# Patient Record
Sex: Male | Born: 1940 | Race: White | Hispanic: No | Marital: Married | Smoking: Former smoker
Health system: Southern US, Community
[De-identification: ages and names within clinical notes are randomized; demographics above are authoritative.]

## PROBLEM LIST (undated history)

## (undated) DIAGNOSIS — I2510 Atherosclerotic heart disease of native coronary artery without angina pectoris: Secondary | ICD-10-CM

## (undated) DIAGNOSIS — I219 Acute myocardial infarction, unspecified: Secondary | ICD-10-CM

## (undated) DIAGNOSIS — K648 Other hemorrhoids: Secondary | ICD-10-CM

## (undated) DIAGNOSIS — K222 Esophageal obstruction: Secondary | ICD-10-CM

## (undated) DIAGNOSIS — K219 Gastro-esophageal reflux disease without esophagitis: Secondary | ICD-10-CM

## (undated) DIAGNOSIS — G2581 Restless legs syndrome: Secondary | ICD-10-CM

## (undated) DIAGNOSIS — E669 Obesity, unspecified: Secondary | ICD-10-CM

## (undated) DIAGNOSIS — K224 Dyskinesia of esophagus: Secondary | ICD-10-CM

## (undated) DIAGNOSIS — D649 Anemia, unspecified: Secondary | ICD-10-CM

## (undated) DIAGNOSIS — Z86010 Personal history of colonic polyps: Secondary | ICD-10-CM

## (undated) DIAGNOSIS — J986 Disorders of diaphragm: Secondary | ICD-10-CM

## (undated) DIAGNOSIS — E785 Hyperlipidemia, unspecified: Secondary | ICD-10-CM

## (undated) DIAGNOSIS — I10 Essential (primary) hypertension: Secondary | ICD-10-CM

## (undated) DIAGNOSIS — J189 Pneumonia, unspecified organism: Secondary | ICD-10-CM

## (undated) DIAGNOSIS — Z8719 Personal history of other diseases of the digestive system: Secondary | ICD-10-CM

## (undated) DIAGNOSIS — M5090 Cervical disc disorder, unspecified, unspecified cervical region: Secondary | ICD-10-CM

## (undated) DIAGNOSIS — Z860101 Personal history of adenomatous and serrated colon polyps: Secondary | ICD-10-CM

## (undated) DIAGNOSIS — C4490 Unspecified malignant neoplasm of skin, unspecified: Secondary | ICD-10-CM

## (undated) DIAGNOSIS — Z9889 Other specified postprocedural states: Secondary | ICD-10-CM

## (undated) DIAGNOSIS — I358 Other nonrheumatic aortic valve disorders: Secondary | ICD-10-CM

## (undated) DIAGNOSIS — Z9981 Dependence on supplemental oxygen: Secondary | ICD-10-CM

## (undated) DIAGNOSIS — K5790 Diverticulosis of intestine, part unspecified, without perforation or abscess without bleeding: Secondary | ICD-10-CM

## (undated) DIAGNOSIS — IMO0001 Reserved for inherently not codable concepts without codable children: Secondary | ICD-10-CM

## (undated) HISTORY — DX: Other nonrheumatic aortic valve disorders: I35.8

## (undated) HISTORY — DX: Diverticulosis of intestine, part unspecified, without perforation or abscess without bleeding: K57.90

## (undated) HISTORY — DX: Dyskinesia of esophagus: K22.4

## (undated) HISTORY — DX: Unspecified malignant neoplasm of skin, unspecified: C44.90

## (undated) HISTORY — DX: Obesity, unspecified: E66.9

## (undated) HISTORY — PX: TONSILLECTOMY: SUR1361

## (undated) HISTORY — PX: SKIN CANCER EXCISION: SHX779

## (undated) HISTORY — DX: Acute myocardial infarction, unspecified: I21.9

## (undated) HISTORY — PX: INGUINAL HERNIA REPAIR: SUR1180

## (undated) HISTORY — DX: Hyperlipidemia, unspecified: E78.5

## (undated) HISTORY — DX: Gastro-esophageal reflux disease without esophagitis: K21.9

## (undated) HISTORY — DX: Personal history of other diseases of the digestive system: Z87.19

## (undated) HISTORY — DX: Essential (primary) hypertension: I10

## (undated) HISTORY — DX: Atherosclerotic heart disease of native coronary artery without angina pectoris: I25.10

## (undated) HISTORY — DX: Esophageal obstruction: K22.2

## (undated) HISTORY — DX: Pneumonia, unspecified organism: J18.9

## (undated) HISTORY — DX: Other hemorrhoids: K64.8

## (undated) HISTORY — DX: Cervical disc disorder, unspecified, unspecified cervical region: M50.90

## (undated) HISTORY — PX: CARDIAC CATHETERIZATION: SHX172

## (undated) HISTORY — DX: Disorders of diaphragm: J98.6

## (undated) HISTORY — DX: Restless legs syndrome: G25.81

## (undated) HISTORY — DX: Other specified postprocedural states: Z98.890

---

## 1998-05-02 HISTORY — PX: CORONARY ANGIOPLASTY WITH STENT PLACEMENT: SHX49

## 1999-04-02 DIAGNOSIS — I219 Acute myocardial infarction, unspecified: Secondary | ICD-10-CM

## 1999-04-02 HISTORY — DX: Acute myocardial infarction, unspecified: I21.9

## 1999-04-14 ENCOUNTER — Inpatient Hospital Stay (HOSPITAL_COMMUNITY): Admission: EM | Admit: 1999-04-14 | Discharge: 1999-04-17 | Payer: Self-pay | Admitting: Cardiology

## 2001-07-02 ENCOUNTER — Encounter: Payer: Self-pay | Admitting: Emergency Medicine

## 2001-07-02 ENCOUNTER — Encounter: Admission: RE | Admit: 2001-07-02 | Discharge: 2001-07-02 | Payer: Self-pay | Admitting: Emergency Medicine

## 2001-08-30 HISTORY — PX: COLONOSCOPY: SHX174

## 2001-09-05 ENCOUNTER — Ambulatory Visit (HOSPITAL_COMMUNITY): Admission: RE | Admit: 2001-09-05 | Discharge: 2001-09-05 | Payer: Self-pay | Admitting: Gastroenterology

## 2001-09-05 ENCOUNTER — Encounter: Payer: Self-pay | Admitting: Internal Medicine

## 2003-01-24 ENCOUNTER — Encounter: Payer: Self-pay | Admitting: Internal Medicine

## 2003-01-29 ENCOUNTER — Encounter: Payer: Self-pay | Admitting: Internal Medicine

## 2003-01-29 ENCOUNTER — Encounter: Payer: Self-pay | Admitting: Emergency Medicine

## 2003-01-29 ENCOUNTER — Encounter: Admission: RE | Admit: 2003-01-29 | Discharge: 2003-01-29 | Payer: Self-pay | Admitting: Emergency Medicine

## 2004-01-15 ENCOUNTER — Encounter: Admission: RE | Admit: 2004-01-15 | Discharge: 2004-01-15 | Payer: Self-pay | Admitting: Emergency Medicine

## 2004-05-28 ENCOUNTER — Encounter: Admission: RE | Admit: 2004-05-28 | Discharge: 2004-05-28 | Payer: Self-pay | Admitting: Emergency Medicine

## 2004-08-05 ENCOUNTER — Ambulatory Visit: Payer: Self-pay | Admitting: Cardiology

## 2004-11-10 ENCOUNTER — Ambulatory Visit: Payer: Self-pay | Admitting: Cardiology

## 2005-01-18 ENCOUNTER — Ambulatory Visit: Payer: Self-pay | Admitting: Cardiology

## 2005-08-25 ENCOUNTER — Ambulatory Visit: Payer: Self-pay | Admitting: *Deleted

## 2005-10-10 ENCOUNTER — Ambulatory Visit: Payer: Self-pay

## 2008-01-28 ENCOUNTER — Ambulatory Visit: Payer: Self-pay | Admitting: Internal Medicine

## 2008-01-28 DIAGNOSIS — K224 Dyskinesia of esophagus: Secondary | ICD-10-CM

## 2008-01-28 DIAGNOSIS — K219 Gastro-esophageal reflux disease without esophagitis: Secondary | ICD-10-CM

## 2008-01-31 HISTORY — PX: ESOPHAGOGASTRODUODENOSCOPY: SHX1529

## 2008-02-05 ENCOUNTER — Encounter: Payer: Self-pay | Admitting: Internal Medicine

## 2008-02-05 ENCOUNTER — Ambulatory Visit: Payer: Self-pay | Admitting: Internal Medicine

## 2008-02-13 ENCOUNTER — Encounter: Payer: Self-pay | Admitting: Internal Medicine

## 2008-03-05 ENCOUNTER — Ambulatory Visit: Payer: Self-pay | Admitting: Internal Medicine

## 2010-01-30 DIAGNOSIS — J189 Pneumonia, unspecified organism: Secondary | ICD-10-CM

## 2010-01-30 HISTORY — DX: Pneumonia, unspecified organism: J18.9

## 2010-02-09 ENCOUNTER — Emergency Department (HOSPITAL_COMMUNITY): Admission: EM | Admit: 2010-02-09 | Discharge: 2010-02-09 | Payer: Self-pay | Admitting: Emergency Medicine

## 2010-02-10 ENCOUNTER — Inpatient Hospital Stay (HOSPITAL_COMMUNITY): Admission: AD | Admit: 2010-02-10 | Discharge: 2010-02-12 | Payer: Self-pay | Admitting: Cardiology

## 2010-02-10 ENCOUNTER — Ambulatory Visit: Payer: Self-pay | Admitting: Cardiology

## 2010-02-10 ENCOUNTER — Encounter: Payer: Self-pay | Admitting: Cardiology

## 2010-02-18 ENCOUNTER — Telehealth (INDEPENDENT_AMBULATORY_CARE_PROVIDER_SITE_OTHER): Payer: Self-pay | Admitting: *Deleted

## 2010-02-19 DIAGNOSIS — I10 Essential (primary) hypertension: Secondary | ICD-10-CM | POA: Insufficient documentation

## 2010-02-19 DIAGNOSIS — R079 Chest pain, unspecified: Secondary | ICD-10-CM

## 2010-02-19 DIAGNOSIS — E785 Hyperlipidemia, unspecified: Secondary | ICD-10-CM

## 2010-02-20 ENCOUNTER — Encounter: Payer: Self-pay | Admitting: Cardiology

## 2010-02-20 DIAGNOSIS — I2510 Atherosclerotic heart disease of native coronary artery without angina pectoris: Secondary | ICD-10-CM | POA: Insufficient documentation

## 2010-02-22 ENCOUNTER — Ambulatory Visit: Payer: Self-pay

## 2010-02-22 ENCOUNTER — Encounter (HOSPITAL_COMMUNITY)
Admission: RE | Admit: 2010-02-22 | Discharge: 2010-05-01 | Payer: Self-pay | Source: Home / Self Care | Attending: Cardiology | Admitting: Cardiology

## 2010-02-22 ENCOUNTER — Encounter: Payer: Self-pay | Admitting: Internal Medicine

## 2010-02-22 ENCOUNTER — Ambulatory Visit: Payer: Self-pay | Admitting: Internal Medicine

## 2010-02-25 ENCOUNTER — Ambulatory Visit: Payer: Self-pay | Admitting: Cardiology

## 2010-02-25 DIAGNOSIS — I359 Nonrheumatic aortic valve disorder, unspecified: Secondary | ICD-10-CM | POA: Insufficient documentation

## 2010-03-15 ENCOUNTER — Ambulatory Visit: Payer: Self-pay | Admitting: Internal Medicine

## 2010-03-15 DIAGNOSIS — R140 Abdominal distension (gaseous): Secondary | ICD-10-CM

## 2010-03-29 ENCOUNTER — Telehealth: Payer: Self-pay | Admitting: Internal Medicine

## 2010-05-07 ENCOUNTER — Ambulatory Visit
Admission: RE | Admit: 2010-05-07 | Discharge: 2010-05-07 | Payer: Self-pay | Source: Home / Self Care | Attending: Internal Medicine | Admitting: Internal Medicine

## 2010-06-01 NOTE — Miscellaneous (Signed)
  Clinical Lists Changes  Problems: Added new problem of CAD (ICD-414.00) Observations: Added new observation of PAST MED HX: CAD   MI...04/1999...subtotal LAD.Marland Kitchenstented.......residual  50% distal LAD, Cx 50-70%, 40% RCA  /  nuclear...2005...no ischemia GERD Esophageal Dysmotility Hypertension Dyslipidemia Myalgias Diverticulosis EF  64%...NUCLEAR...2005  /  60-65%....ECHO...02/10/2010 Hospitalization 02/10/2010.Marland KitchenMarland Kitchen?pneumonia ?other with orthostasis,SOB, Chest pain...  (02/20/2010 21:36) Added new observation of PRIMARY MD: Melissa Noon (02/20/2010 21:36)       Past History:  Past Medical History: CAD   MI...04/1999...subtotal LAD.Marland Kitchenstented.......residual  50% distal LAD, Cx 50-70%, 40% RCA  /  nuclear...2005...no ischemia GERD Esophageal Dysmotility Hypertension Dyslipidemia Myalgias Diverticulosis EF  64%...NUCLEAR...2005  /  60-65%....ECHO...02/10/2010 Hospitalization 02/10/2010.Marland KitchenMarland Kitchen?pneumonia ?other with orthostasis,SOB, Chest pain.Marland KitchenMarland Kitchen

## 2010-06-01 NOTE — Progress Notes (Signed)
Summary: Condition update  Phone Note Call from Patient Call back at Home Phone [phone] Call back at [phone] cell   Caller: Patient Call For: Dr. Leone Payor Reason for Call: Talk to Nurse Summary of Call: Condition update: Has not taken the antibotic that was prescribed b/c he has some dental work done, no improvement Initial call taken by: Karna Christmas,  March 29, 2010 12:00 PM  Follow-up for Phone Call        Left message for patient to call back Darcey Nora RN, [facility]  March 29, 2010 1:40 PM  Patient was started on an antibiotic he can't remember the name.  He has had a tooth pulled. He was advised by his dentist to hold metronidazole until he completed the antibiotics.  He tried metronidazole after his course of antibiotics. 1 pill "made me very sick".  I reviewed alcohol use with him and he denies.  He has had some very slight improvement with probiotics.  Please advise on metronidazole. Follow-up by: Darcey Nora RN, CGRN,  March 29, 2010 2:12 PM  Additional Follow-up for Phone Call Additional follow up Details #1::        He can try Align daily for 1 month if he does not want to take metronidazole then REV me in 4-6 weks from now Additional Follow-up by: Iva Boop MD, Clementeen Graham,  March 29, 2010 4:26 PM    Additional Follow-up for Phone Call Additional follow up Details #2::    Patient  advised of Dr Marvell Fuller recommendations.  REV scheduled for 05/07/10 1:30 Follow-up by: Darcey Nora RN, CGRN,  March 29, 2010 4:58 PM

## 2010-06-01 NOTE — Assessment & Plan Note (Signed)
Summary: GERD--ch.   History of Present Illness Visit Type: Follow-up Visit Primary GI MD: Stan Head MD [facility] Primary Provider: [name], MD Requesting Provider: Effie Shy Chief Complaint: Severe indigestion after taking a large amount of antibiotics from when he was at [facility] for pneomonia and breathing issues History of Present Illness:   70 yo wm with significant gas and bloating as well as heartburn and acid reflux issues. These problems began after he took antibiotics for pneumonia as an inpatient and outpatient last month (Avelox). He has experienced dysphagia, with water or solids, unabl eto swallow. Similar to prior problems. Perhaps more severe.  He had been on pantoprazole and then Zegerid with success. He thinks drinking Telford Nab tea allowed hm to have complete relief of heartburn (with PPI)  he has tried Librarian, academic without benefit (x 5 days)  +++gas and borborygmi no diarrhea was on Avelox for pneumonia - firts oral dose was vomited   GI Review of Systems    Reports acid reflux, belching, heartburn, and  nausea.      Denies abdominal pain, bloating, chest pain, dysphagia with liquids, dysphagia with solids, loss of appetite, vomiting, vomiting blood, weight loss, and  weight gain.        Denies anal fissure, black tarry stools, change in bowel habit, constipation, diarrhea, diverticulosis, fecal incontinence, heme positive stool, hemorrhoids, irritable bowel syndrome, jaundice, light color stool, liver problems, rectal bleeding, and  rectal pain.    Current Medications (verified): 1)  Metoprolol Tartrate 50 Mg Tabs (Metoprolol Tartrate) .Marland Kitchen.. 1 Tab By Mouth Once Daily 2)  Crestor 10 Mg Tabs (Rosuvastatin Calcium) .Marland Kitchen.. 1 By Mouth Once Daily 3)  Aspirin 81 Mg Tabs (Aspirin) .... 2 By Mouth Once Daily 4)  Zegerid 40-1100 Mg Caps (Omeprazole-Sodium Bicarbonate) .Marland Kitchen.. 1 By Mouth Once Daily 5)  Nitrostat 0.4 Mg Subl (Nitroglycerin) .Marland Kitchen.. 1 Tablet Under Tongue At  Onset of Chest Pain; You May Repeat Every 5 Minutes For Up To 3 Doses. 6)  Hydrocodone-Acetaminophen 5-500 Mg Tabs (Hydrocodone-Acetaminophen) .... As Needed 7)  Quinapril-Hydrochlorothiazide 20-12.5 Mg Tabs (Quinapril-Hydrochlorothiazide) .Marland Kitchen.. 1 By Mouth Once Daily  Allergies (verified): No Known Drug Allergies  Past History:  Past Surgical History: Last updated: 01/28/2008 PTCA-Stent Hernia repair  Family History: Last updated: 01/28/2008 No FH of Colon Cancer:  Social History: Last updated: 03/15/2010 Patient is a former smoker.  Alcohol Use - no Daily Caffeine Use: rare married, lives with wife and daughter (adopted) Illicit Drug Use - no Patient does not get regular exercise.   Past Medical History: CAD   MI...04/1999...subtotal LAD.Marland Kitchenstented.......residual  50% distal LAD, Cx 50-70%, 40% RCA  /  nuclear...2005...no ischemia GERD Esophageal Dysmotility Hypertension Dyslipidemia Myalgias Diverticulosis EF  64%...NUCLEAR...2005  /  60-65%....ECHO...02/10/2010 Aortic valve sclerosis    echo... October, 2011 Hospitalization 02/10/2010.Marland KitchenMarland Kitchen?pneumonia ?other with orthostasis,SOB, Chest pain... Elevated right hemidiaphragm Pneumonia  Family History: Reviewed history from 01/28/2008 and no changes required. No FH of Colon Cancer:  Social History: Patient is a former smoker.  Alcohol Use - no Daily Caffeine Use: rare married, lives with wife and daughter (adopted) Illicit Drug Use - no Patient does not get regular exercise.   Review of Systems       The patient complains of shortness of breath.         indigestion neck pain and radiation of pain into left arm - just had epidural All other ROS negative except as per HPI.   Vital Signs:  Patient profile:   69 year  old male Height:      69 inches Weight:      198 pounds BMI:     29.35 BSA:     2.06 Pulse rate:   80 / minute Pulse rhythm:   regular BP sitting:   110 / 70  (left arm)  Vitals Entered By:  Merri Ray CMA Duncan Dull) (March 15, 2010 3:49 PM)  Physical Exam  Eyes:  anicteric Lungs:  Clear throughout to auscultation. Heart:  Regular rate and rhythm; no murmurs, rubs,  or bruits. Abdomen:  mildly distended BS+ borborygmi nontender soft Extremities:  no edema Psych:  Alert and cooperative. Normal mood and affect.   Impression & Recommendations:  Problem # 1:  FLATULENCE ERUCTATION AND GAS PAIN (ICD-787.3) Assessment New This developed after antibiotic therapy for pneumonia which suggests disruption of bacterial flora as a cause. Will treat with metronidazole to see if that resolves.  Problem # 2:  GERD (ICD-530.81) Assessment: Deteriorated started after the antibiotics. ? related to this and suspected change in bacterial flora of intestine that is suspected. Se what happens after metronidazole. he understands the theory being applied.  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  Patient Instructions: 1)  Please pick up your medications at your pharmacy. METRONIDAZOLE 2)  Please call our office in 2-3 weeks with an update.  Call sooner if needed. 3)  Copy sent to : Dossie Arbour, MD; Jerral Bonito, MD 4)  The medication list was reviewed and reconciled.  All changed / newly prescribed medications were explained.  A complete medication list was provided to the patient / caregiver. Prescriptions: METRONIDAZOLE 500 MG TABS (METRONIDAZOLE) Take 1 tablet by mouth three times a day for 10 days  #30 x 0   Entered by:   Francee Piccolo CMA (AAMA)   Authorized by:   Iva Boop MD, [facility]   Signed by:   Francee Piccolo CMA (AAMA) on 03/15/2010   Method used:   Electronically to        [facility]. [phone]* (retail)       [phone]       [place], [place]  [address]       Ph: [phone]       Fax: [phone]   RxID:   [id number]

## 2010-06-01 NOTE — Progress Notes (Signed)
Summary: Nuclear pre procedure  Phone Note Outgoing Call Call back at [facility] Phone [phone]   Call placed by: Rea College, CMA,  February 18, 2010 4:38 PM Call placed to: Patient Summary of Call: Reviewed information on Myoview Information Sheet (see scanned document for further details).  Spoke with patient's daughter.      Nuclear Med Background Indications for Stress Test: Evaluation for Ischemia, Post Hospital  Indications Comments: 02/12/10 chest pain, pneumonia, negative enzymes  History: Echo, Heart Catheterization, Myocardial Infarction, Myocardial Perfusion Study, Stents  History Comments: '05 [id number]  Symptoms: Chest Pain, Chest Pressure, Chest Tightness, Nausea, SOB, Vomiting    Nuclear Pre-Procedure Cardiac Risk Factors: Family History - CAD, History of Smoking, Hypertension, Lipids Height (in): 69

## 2010-06-01 NOTE — Assessment & Plan Note (Signed)
Summary: Cardiology Nuclear Testing  Nuclear Med Background Indications for Stress Test: Evaluation for Ischemia, Stent Patency, Post Hospital  Indications Comments: 02/12/10 chest pain, pneumonia, negative enzymes  History: Echo, Heart Catheterization, Myocardial Infarction, Myocardial Perfusion Study, Stents  History Comments: '05 [id number]  Symptoms: Chest Pain, Chest Pressure, Chest Tightness, DOE, Fatigue with Exertion, Nausea, SOB, Syncope, Vomiting  Symptoms Comments: Syncope after NTG given in hospital recently per patient.   Nuclear Pre-Procedure Cardiac Risk Factors: Family History - CAD, History of Smoking, Hypertension, Lipids Caffeine/Decaff Intake: None NPO After: 6:30 PM Lungs: Clear IV 0.9% NS with Angio Cath: 22g     IV Site: R Wrist IV Started by: Bonnita Levan, RN Chest Size (in) 44     Height (in): 69 Weight (lb): 207 BMI: 30.68  Nuclear Med Study 1 or 2 day study:  1 day     Stress Test Type:  Lexiscan Reading MD:  Dietrich Pates, MD     Referring MD:  Lovena Neighbours Resting Radionuclide:  Technetium 95m Tetrofosmin     Resting Radionuclide Dose:  11 mCi  Stress Radionuclide:  Technetium 74m Tetrofosmin     Stress Radionuclide Dose:  33 mCi   Stress Protocol      Max HR:  101 bpm     Predicted Max HR:  151 bpm  Max Systolic BP: 158 mm Hg     Percent Max HR:  66.89 %Rate Pressure Product:  91478  Lexiscan: 0.4 mg   Stress Test Technologist:  Irean Hong,  RN     Nuclear Technologist:  Domenic Polite, CNMT  Rest Procedure  Myocardial perfusion imaging was performed at rest 45 minutes following the intravenous administration of Technetium 77m Tetrofosmin.  Stress Procedure  The patient received IV Lexiscan 0.4 mg over 15-seconds.  Technetium 36m Tetrofosmin injected at 30-seconds.  There were no significant changes with Lexiscan, occ. PVC's.  Quantitative spect images were obtained after a 45 minute delay.  QPS Raw Data Images:  Soft tissue (diaphragm, bowel  activity, subcutaneous fat) surround heart Stress Images:  Defect in the distal inferoseptal, distal inferior and apical walls.  .  Basal inferolateral, basal inferior thinning.  Otherwise normal perfusion. Rest Images:  No significant change from the stress images. Subtraction (SDS):  No significant ischemia. Transient Ischemic Dilatation:  1.06  (Normal <1.22)  Lung/Heart Ratio:  .33  (Normal <0.45)  Quantitative Gated Spect Images QGS EDV:  104 ml QGS ESV:  41 ml QGS EF:  61 % QGS cine images:  Apical hypokinesis.   Overall Impression  Exercise Capacity: Lexiscan with no exercise. BP Response: Normal blood pressure response. Clinical Symptoms: No chest pain ECG Impression: No significant ST segment change suggestive of ischemia. Overall Impression Comments:  Small region of Scar and possible soft tissue attenuation  in the distal inferior, inferoseptal and apical walls.  No ischemia..  Overall low risk scan.  Appended Document: Cardiology Nuclear Testing Good  Appended Document: Cardiology Nuclear Testing pt aware at appt 10/27

## 2010-06-01 NOTE — Assessment & Plan Note (Signed)
Summary: Todd Stevens call/lg   Visit Type:  post hospitalization Primary Todd Stevens:  Todd Matin, MD  CC:  CAD.  History of Present Illness: The patient is seen post hospitalization.  He is doing well at this time.  I saw him last in our office in 2006.  He was hospitalized on February 10, 2010.  His presentation is unusual.  He had some shortness of breath with orthostasis and some chest pain.  The chest x-ray was mildly abnormal.  There was question of pneumonia.  He did not have an elevated white blood count.  His enzymes were negative.  Echo revealed normal LV function with an ejection fraction of 60-65%.  He does have some aortic valve sclerosis.  Since home he has been stable.  He has been having some problems with his GERD.  Patient had an outpatient nuclear scan February 22, 2010.  This is a low-risk scan.  Current Medications (verified): 1)  Metoprolol Tartrate 50 Mg Tabs (Metoprolol Tartrate) .Marland Kitchen.. 1 Tab By Mouth Once Daily 2)  Lisinopril 20 Mg Tabs (Lisinopril) .... Take One Tablet By Mouth Daily 3)  Crestor 10 Mg Tabs (Rosuvastatin Calcium) .Marland Kitchen.. 1 By Mouth Once Daily 4)  Aspirin 81 Mg Tabs (Aspirin) .... 2 By Mouth Once Daily 5)  Zegerid 40-1100 Mg Caps (Omeprazole-Sodium Bicarbonate) .... Take 1 Tablet By Mouth Two Times A Day 6)  Nitrostat 0.4 Mg Subl (Nitroglycerin) .Marland Kitchen.. 1 Tablet Under Tongue At Onset of Chest Pain; You May Repeat Every 5 Minutes For Up To 3 Doses. 7)  Hydrocodone-Acetaminophen 5-500 Mg Tabs (Hydrocodone-Acetaminophen) .... As Needed 8)  Align  Caps (Probiotic Product) .... Once Daily  Allergies (verified): No Known Drug Allergies  Past History:  Past Medical History: CAD   MI...04/1999...subtotal LAD.Marland Kitchenstented.......residual  50% distal LAD, Cx 50-70%, 40% RCA  /  nuclear...2005...no ischemia GERD Esophageal Dysmotility Hypertension Dyslipidemia Myalgias Diverticulosis EF  64%...NUCLEAR...2005  /  60-65%....ECHO...02/10/2010 Aortic valve  sclerosis    echo... October, 2011 Hospitalization 02/10/2010.Marland KitchenMarland Kitchen?pneumonia ?other with orthostasis,SOB, Chest pain... Elevated right hemidiaphragm  Review of Systems       Patient denies fever, chills, headache, sweats, rash, change in vision, change in hearing, chest pain, nausea vomiting, urinary symptoms.  He does have some GERD symptoms.  All other systems are reviewed and are negative.  Vital Signs:  Patient profile:   70 year old male Height:      69 inches Weight:      208 pounds BMI:     30.83 Pulse rate:   80 / minute BP sitting:   140 / 72  (right arm) Cuff size:   regular  Vitals Entered By: Hardin Negus, RMA (February 25, 2010 3:21 PM)  Physical Exam  General:  patient is stable today. Eyes:  no xanthelasma. Neck:  no jugular venous distention. Lungs:  lungs are clear.  Respiratory effort is nonlabored. Heart:  cardiac exam reveals S1-S2.  No clicks or significant murmurs. Abdomen:  abdomen is soft. Extremities:  no peripheral edema. Psych:  patient is oriented to person time and place.  Affect is normal.   Impression & Recommendations:  Problem # 1:  AORTIC VALVE SCLEROSIS (ICD-424.1)  His updated medication list for this problem includes:    Metoprolol Tartrate 50 Mg Tabs (Metoprolol tartrate) .Marland Kitchen... 1 tab by mouth once daily    Lisinopril 20 Mg Tabs (Lisinopril) .Marland Kitchen... Take one tablet by mouth daily    Nitrostat 0.4 Mg Subl (Nitroglycerin) .Marland Kitchen... 1 tablet under tongue at onset  of chest pain; you may repeat every 5 minutes for up to 3 doses. the patient has aortic valve sclerosis.  No further workup is needed.  Problem # 2:  CAD (ICD-414.00)  His updated medication list for this problem includes:    Metoprolol Tartrate 50 Mg Tabs (Metoprolol tartrate) .Marland Kitchen... 1 tab by mouth once daily    Lisinopril 20 Mg Tabs (Lisinopril) .Marland Kitchen... Take one tablet by mouth daily    Aspirin 81 Mg Tabs (Aspirin) .Marland Kitchen... 2 by mouth once daily    Nitrostat 0.4 Mg Subl (Nitroglycerin)  .Marland Kitchen... 1 tablet under tongue at onset of chest pain; you may repeat every 5 minutes for up to 3 doses. Coronary disease is stable.  The patient had an outpatient nuclear scan on February 22, 2010.  There is question of mild scar.  There is no ischemia.  This is a low-risk scan.  No further workup is needed.  Problem # 3:  GERD (ICD-530.81)  The following medications were removed from the medication list:    Ranitidine Hcl 150 Mg Tabs (Ranitidine hcl) .Marland Kitchen... 1 each night po His updated medication list for this problem includes:    Zegerid 40-1100 Mg Caps (Omeprazole-sodium bicarbonate) .Marland Kitchen... Take 1 tablet by mouth two times a day The patient's GERD is causing significant symptoms.  He has seen Dr.Gessner before and would like to see him again.  Problem # 4:  recent hospitalization Is really not clear exactly what the main problem was with the patient's recent hospitalization.  He presented with some orthostasis and shortness of breath and chest discomfort.  There was question of mild pneumonia but no elevated white count.  He was treated with antibiotics.  There was no MI.  His post hospital nuclear scan shows no ischemia.  No further workup is recommended.  It is possible the GERD is playing a role with his symptoms.  Patient Instructions: 1)  Your physician recommends that you schedule a follow-up appointment in: as needed.  2)  Pt. has an appointment with Dr. Leone Payor on Nov.14th/11 at 3:45 PM 3)  Your physician recommends that you continue on your current medications as directed. Please refer to the Current Medication list given to you today.

## 2010-06-03 NOTE — Assessment & Plan Note (Signed)
Summary: follow up bacterial overgrowth/sheri   History of Present Illness Visit Type: Follow-up Visit Primary GI MD: Stan Head MD [place] Primary Provider: [name], MD Requesting Provider: Effie Shy Chief Complaint: Follow up, wants to discuss his PPI. Pt has stopped Omeprazole and started taking Nexium History of Present Illness:   70 yo wm with suspected bacterial overgrowth or disruption of bacterial biome in the intestine after a critical illness. he eventually tried meronidazole but it did not seem to help. He chamged from omeprazole to Nexium (left over) He also began drinking Telford Nab tea again and feels much better.  He has some constipation due to narcotic use and is moving bowels most days on stool softeners and occasional dulcolax.   GI Review of Systems      Denies abdominal pain, acid reflux, belching, bloating, chest pain, dysphagia with liquids, dysphagia with solids, heartburn, loss of appetite, nausea, vomiting, vomiting blood, weight loss, and  weight gain.        Denies anal fissure, black tarry stools, change in bowel habit, constipation, diarrhea, diverticulosis, fecal incontinence, heme positive stool, hemorrhoids, irritable bowel syndrome, jaundice, light color stool, liver problems, rectal bleeding, and  rectal pain.    Current Medications (verified): 1)  Metoprolol Tartrate 50 Mg Tabs (Metoprolol Tartrate) .Marland Kitchen.. 1 Tab By Mouth Once Daily 2)  Crestor 10 Mg Tabs (Rosuvastatin Calcium) .Marland Kitchen.. 1 By Mouth Once Daily 3)  Aspirin 81 Mg Tabs (Aspirin) .... 2 By Mouth Once Daily 4)  Nitrostat 0.4 Mg Subl (Nitroglycerin) .Marland Kitchen.. 1 Tablet Under Tongue At Onset of Chest Pain; You May Repeat Every 5 Minutes For Up To 3 Doses. 5)  Hydrocodone-Acetaminophen 5-500 Mg Tabs (Hydrocodone-Acetaminophen) .... As Needed 6)  Quinapril-Hydrochlorothiazide 20-12.5 Mg Tabs (Quinapril-Hydrochlorothiazide) .Marland Kitchen.. 1 By Mouth Once Daily 7)  Nexium 40 Mg Cpdr (Esomeprazole  Magnesium) .Marland Kitchen.. 1 By Mouth Once Daily  Allergies (verified): No Known Drug Allergies  Past History:  Past Surgical History: Last updated: 01/28/2008 PTCA-Stent Hernia repair  Family History: Last updated: 01/28/2008 No FH of Colon Cancer:  Social History: Last updated: 03/15/2010 Patient is a former smoker.  Alcohol Use - no Daily Caffeine Use: rare married, lives with wife and daughter (adopted) Illicit Drug Use - no Patient does not get regular exercise.   Past Medical History: CAD   MI...04/1999...subtotal LAD.Marland Kitchenstented.......residual  50% distal LAD, Cx 50-70%, 40% RCA  /  nuclear...2005...no ischemia GERD Esophageal Dysmotility Hypertension Dyslipidemia Myalgias Diverticulosis EF  64%...NUCLEAR...2005  /  60-65%....ECHO...02/10/2010 Aortic valve sclerosis    echo... October, 2011 Hospitalization 02/10/2010.Marland KitchenMarland Kitchen?pneumonia ?other with orthostasis,SOB, Chest pain... Elevated right hemidiaphragm Pneumonia Neuropathy/Cervical disc disease  Review of Systems       neurology issues and and in upper extremity and shoulder pain - seeing neuro and neurosurgery he also has weakness in hands/fingers neurosurgeon not sure C spine is problem though there is a degenerative disc   Vital Signs:  Patient profile:   70 year old male Height:      69 inches Weight:      194 pounds BMI:     28.75 BSA:     2.04 Pulse rate:   80 / minute Pulse rhythm:   regular BP sitting:   90 / 62  (left arm)  Vitals Entered By: Merri Ray CMA Duncan Dull) (May 07, 2010 11:34 AM)  Physical Exam  General:  Well developed, well nourished, no acute distress.   Impression & Recommendations:  Problem # 1:  GERD (ICD-530.81) Assessment Improved Will switch  to pantoprazole after he finishes Nexium (not covered) I warned him about overdoing tea (caffeine).  Problem # 2:  FLATULENCE ERUCTATION AND GAS PAIN (ICD-787.3) Assessment: Improved he attributes to PPI and Telford Nab tea it may  have been tincture of time that improved it  Problem # 3:  CONSTIPATION (ICD-564.00) Assessment: New related to narcotics he is managing with stool softeners and occasional laxative advised him about MiraLax if it is more problematic  Patient Instructions: 1)  Pick up your prescription from your pharmacy for pantoprazole. 2)  Please schedule a follow-up appointment as needed.  3)  The medication list was reviewed and reconciled.  All changed / newly prescribed medications were explained.  A complete medication list was provided to the patient / caregiver. Prescriptions: PANTOPRAZOLE SODIUM 40 MG TBEC (PANTOPRAZOLE SODIUM) 1 by mouth once daily 30 minutes before a meal  #30 x 11   Entered and Authorized by:   Iva Boop MD, [facility]   Signed by:   Iva Boop MD, [facility] on 05/07/2010   Method used:   Electronically to        [facility]. [phone]* (retail)       [address]       [place], [place]  [address]       Ph: [phone]       Fax: [phone]   RxID:   [id number] PANTOPRAZOLE SODIUM 40 MG TBEC (PANTOPRAZOLE SODIUM) 1 by mouth once daily 30 minutes before a meal  #30 x 11   Entered and Authorized by:   Iva Boop MD, [facility]   Signed by:   Iva Boop MD, [facility] on 05/07/2010   Method used:   Electronically to        [facility]. [phone]* (retail)       [phone]       [place], [place]  [address]       Ph: [phone]       Fax: [phone]   RxID:   [id number]  cc: Dossie Arbour, MD

## 2010-07-15 LAB — CBC
HCT: 47.6 % (ref 39.0–52.0)
HCT: 48.4 % (ref 39.0–52.0)
Hemoglobin: 14.4 g/dL (ref 13.0–17.0)
Hemoglobin: 16.9 g/dL (ref 13.0–17.0)
Hemoglobin: 17.7 g/dL — ABNORMAL HIGH (ref 13.0–17.0)
MCH: 31.6 pg (ref 26.0–34.0)
MCH: 32.1 pg (ref 26.0–34.0)
MCH: 32.2 pg (ref 26.0–34.0)
MCV: 87.7 fL (ref 78.0–100.0)
MCV: 88 fL (ref 78.0–100.0)
Platelets: 249 10*3/uL (ref 150–400)
RBC: 4.62 MIL/uL (ref 4.22–5.81)
RBC: 5.34 MIL/uL (ref 4.22–5.81)
RBC: 5.5 MIL/uL (ref 4.22–5.81)
RBC: 5.54 MIL/uL (ref 4.22–5.81)

## 2010-07-15 LAB — DIFFERENTIAL
Basophils Absolute: 0 10*3/uL (ref 0.0–0.1)
Basophils Absolute: 0 10*3/uL (ref 0.0–0.1)
Basophils Relative: 0 % (ref 0–1)
Basophils Relative: 1 % (ref 0–1)
Eosinophils Absolute: 0 10*3/uL (ref 0.0–0.7)
Eosinophils Absolute: 0.1 10*3/uL (ref 0.0–0.7)
Eosinophils Relative: 0 % (ref 0–5)
Eosinophils Relative: 1 % (ref 0–5)
Lymphocytes Relative: 19 % (ref 12–46)
Lymphocytes Relative: 24 % (ref 12–46)
Lymphs Abs: 1.9 10*3/uL (ref 0.7–4.0)
Lymphs Abs: 2.1 10*3/uL (ref 0.7–4.0)
Monocytes Absolute: 0.7 10*3/uL (ref 0.1–1.0)
Monocytes Absolute: 0.7 10*3/uL (ref 0.1–1.0)
Monocytes Relative: 7 % (ref 3–12)
Monocytes Relative: 8 % (ref 3–12)
Neutro Abs: 5.8 10*3/uL (ref 1.7–7.7)
Neutro Abs: 7.2 10*3/uL (ref 1.7–7.7)
Neutrophils Relative %: 67 % (ref 43–77)
Neutrophils Relative %: 74 % (ref 43–77)

## 2010-07-15 LAB — COMPREHENSIVE METABOLIC PANEL
AST: 23 U/L (ref 0–37)
Alkaline Phosphatase: 58 U/L (ref 39–117)
BUN: 17 mg/dL (ref 6–23)
CO2: 27 mEq/L (ref 19–32)
Chloride: 98 mEq/L (ref 96–112)
Creatinine, Ser: 1.06 mg/dL (ref 0.4–1.5)
GFR calc non Af Amer: >60 mL/min (ref >60)
Potassium: 3.6 mEq/L (ref 3.5–5.1)
Total Bilirubin: 1.1 mg/dL (ref 0.3–1.2)

## 2010-07-15 LAB — CARDIAC PANEL(CRET KIN+CKTOT+MB+TROPI)
CK, MB: 2.6 ng/mL (ref 0.3–4.0)
CK, MB: 2.7 ng/mL (ref 0.3–4.0)
CK, MB: 2.9 ng/mL (ref 0.3–4.0)
Relative Index: 2.3 (ref 0.0–2.5)
Relative Index: 2.4 (ref 0.0–2.5)
Total CK: 120 U/L (ref 7–232)
Troponin I: 0.03 ng/mL (ref 0.00–0.06)

## 2010-07-15 LAB — POCT CARDIAC MARKERS
CKMB, poc: <1 ng/mL — ABNORMAL LOW (ref 1.0–8.0)
CKMB, poc: <1 ng/mL — ABNORMAL LOW (ref 1.0–8.0)
Myoglobin, poc: 54 ng/mL (ref 12–200)
Myoglobin, poc: 63.6 ng/mL (ref 12–200)
Troponin i, poc: <0.05 ng/mL (ref 0.00–0.09)
Troponin i, poc: <0.05 ng/mL (ref 0.00–0.09)

## 2010-07-15 LAB — BASIC METABOLIC PANEL
CO2: 28 mEq/L (ref 19–32)
CO2: 30 mEq/L (ref 19–32)
Calcium: 8.5 mg/dL (ref 8.4–10.5)
Chloride: 98 mEq/L (ref 96–112)
Creatinine, Ser: 0.97 mg/dL (ref 0.4–1.5)
GFR calc Af Amer: >60 mL/min (ref >60)
GFR calc Af Amer: >60 mL/min (ref >60)
GFR calc non Af Amer: >60 mL/min (ref >60)
Sodium: 139 mEq/L (ref 135–145)

## 2010-07-15 LAB — D-DIMER, QUANTITATIVE
D-Dimer, Quant: 0.36 ug/mL-FEU (ref 0.00–0.48)
D-Dimer, Quant: 0.4 ug/mL-FEU (ref 0.00–0.48)

## 2010-07-15 LAB — PROTIME-INR: INR: 0.89 (ref 0.00–1.49)

## 2010-07-15 LAB — BRAIN NATRIURETIC PEPTIDE: Pro B Natriuretic peptide (BNP): 37 pg/mL (ref 0.0–100.0)

## 2010-07-15 LAB — HEPARIN LEVEL (UNFRACTIONATED)
Heparin Unfractionated: 0.52 IU/mL (ref 0.30–0.70)
Heparin Unfractionated: <0.1 IU/mL — ABNORMAL LOW (ref 0.30–0.70)

## 2010-07-15 LAB — PROCALCITONIN: Procalcitonin: <0.1 ng/mL

## 2010-07-15 LAB — HEMOGLOBIN A1C: Hgb A1c MFr Bld: 6 % — ABNORMAL HIGH (ref <5.7)

## 2010-10-06 ENCOUNTER — Telehealth: Payer: Self-pay | Admitting: Internal Medicine

## 2010-10-06 NOTE — Telephone Encounter (Signed)
Patient is having problems with gas and bloating .  He has tried beano and gas -x with no relief.  I have also mailed him a pamphlet on a low gas diet.  He will come for an appointment on 10/15/10 3:30

## 2010-10-15 ENCOUNTER — Ambulatory Visit (INDEPENDENT_AMBULATORY_CARE_PROVIDER_SITE_OTHER): Payer: Medicare Other | Admitting: Internal Medicine

## 2010-10-15 ENCOUNTER — Ambulatory Visit (INDEPENDENT_AMBULATORY_CARE_PROVIDER_SITE_OTHER)
Admission: RE | Admit: 2010-10-15 | Discharge: 2010-10-15 | Disposition: A | Discharge status: Home / Self Care | Payer: Medicare Other | Source: Ambulatory Visit | Attending: Internal Medicine | Admitting: Internal Medicine

## 2010-10-15 ENCOUNTER — Encounter: Payer: Self-pay | Admitting: Internal Medicine

## 2010-10-15 VITALS — BP 104/70 | HR 80 | Ht 69.0 in | Wt 192.0 lb

## 2010-10-15 DIAGNOSIS — R0600 Dyspnea, unspecified: Secondary | ICD-10-CM

## 2010-10-15 DIAGNOSIS — R0989 Other specified symptoms and signs involving the circulatory and respiratory systems: Secondary | ICD-10-CM

## 2010-10-15 DIAGNOSIS — R0609 Other forms of dyspnea: Secondary | ICD-10-CM

## 2010-10-15 DIAGNOSIS — R143 Flatulence: Secondary | ICD-10-CM

## 2010-10-15 DIAGNOSIS — R141 Gas pain: Secondary | ICD-10-CM

## 2010-10-15 DIAGNOSIS — R142 Eructation: Secondary | ICD-10-CM

## 2010-10-15 NOTE — Progress Notes (Signed)
  Constipation is improved off narcotics. He is unable to sleep in the bed due to gas in his stomach and is remaining on O2 at night. Feels bloated all the time, belching and feels like he cannot take a deep breath and actually is using O2 during the day "just so I can catch my breath". Began Beano and Gas Ex with some but defnitely not complete benefit.  ? If he should stay on PPI   Past Medical History  Diagnosis Date  . CAD (coronary artery disease)   . 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 GERD with stricture     stenosis more than stricture  . Esophageal motility disorder   . HTN (hypertension)   . HLD (hyperlipidemia)   . Diverticulosis   . Aortic valve sclerosis   . Neuropathy   . Cervical disc disease     neuropathy  . Internal hemorrhoids   . Pneumonia 01/2010    Hospitalized   Past Surgical History  Procedure Date  . Coronary angioplasty with stent placement   . Hernia repair   . Tonsillectomy   . Esophagogastroduodenoscopy 01/2008    with esophageal dilation - esophageal dysmotility + stenosis, also benign fundic gland gastric polyps  . Cardiac catheterization   . Colonoscopy 09/05/2001    diverticulosis, internal hemorrhoids    reports that he has quit smoking. He has never used smokeless tobacco. He reports that he does not drink alcohol or use illicit drugs. family history includes Coronary artery disease in an unspecified family member. No Known Allergies    Current outpatient prescriptions:aspirin 81 MG tablet, Take 81 mg by mouth daily.  , Disp: , Rfl: ;  HYDROcodone-acetaminophen (VICODIN) 5-500 MG per tablet, Take 1 tablet by mouth every 6 (six) hours as needed.  , Disp: , Rfl: ;  metoprolol (LOPRESSOR) 50 MG tablet, Take 50 mg by mouth 2 (two) times daily.  , Disp: , Rfl: ;  nitroGLYCERIN (NITROSTAT) 0.4 MG SL tablet, Place 0.4 mg under the tongue every 5 (five) minutes as needed.  , Disp: , Rfl:  pantoprazole (PROTONIX) 40 MG tablet, Take 40 mg by mouth daily.  ,  Disp: , Rfl: ;  quinapril-hydrochlorothiazide (ACCURETIC) 20-12.5 MG per tablet, Take 1 tablet by mouth daily.  , Disp: , Rfl: ;  rosuvastatin (CRESTOR) 10 MG tablet, Take 10 mg by mouth daily.  , Disp: , Rfl:   PE WDWN NAD Lungs coarse BS Heart S1S2 no murmur abd tympanitic upper, BS + and sl increased nontender and no mass

## 2010-10-15 NOTE — Assessment & Plan Note (Addendum)
A little better on beano and gas ex. I am having trouble understanding all of his symptoms and the bloating and dyspnea relationship so will get pulmonary consult. Also get abdominal films. CXR, too. No different therapy at this point until we see what pulmonary says.

## 2010-10-15 NOTE — Patient Instructions (Signed)
Please go to the basement upon leaving today to have your test/xray. You have been scheduled with Pulmonary Dr. Shelle Iron on July 3 at 3:45, arrive at 3:30. Please take all medications with you.

## 2010-10-16 ENCOUNTER — Encounter: Payer: Self-pay | Admitting: Internal Medicine

## 2010-10-16 NOTE — Assessment & Plan Note (Signed)
Had pneumonia in October. Still with dyspnea complaints and using oxygen. Many complaints still and relates this to GI issues. Has esophageal dysmotility so could have autoimmune issues though I thought GERD was culprit. Also has elevated hemi-diaphragm. Pulmonary consult with Dr. Shelle Iron to further assess. Patient does not think he has had PFT's. I can further evaluate GI issues or try different therapy pending pulmonary evaluation. CXR today.

## 2010-10-18 NOTE — Progress Notes (Signed)
Quick Note:  Let him know xray results in CXR without active disease ? Some scarring - await Dr. Teddy Spike input Abd films show some gaseous distention of colon - not necessarily a problem Make sure he has low gas diet info (he may already) and I will await Dr. Teddy Spike evaluation prior to other GI tx.  ______

## 2010-11-02 ENCOUNTER — Ambulatory Visit (INDEPENDENT_AMBULATORY_CARE_PROVIDER_SITE_OTHER): Payer: Medicare Other | Admitting: Pulmonary Disease

## 2010-11-02 ENCOUNTER — Encounter: Payer: Self-pay | Admitting: Pulmonary Disease

## 2010-11-02 VITALS — BP 118/76 | HR 77 | Temp 98.2°F | Ht 68.5 in | Wt 191.0 lb

## 2010-11-02 DIAGNOSIS — R0609 Other forms of dyspnea: Secondary | ICD-10-CM

## 2010-11-02 DIAGNOSIS — R0600 Dyspnea, unspecified: Secondary | ICD-10-CM

## 2010-11-02 NOTE — Progress Notes (Signed)
  Subjective:    Patient ID: Todd Stevens, male    DOB: 21-Jul-1940, 70 y.o.   MRN: [id number]  HPI The pt is a 70y/o male who I have been asked to see for dyspnea and an abnormal cxr.  He has had sob for several years, and feels it has been getting worse the past few months.  It is worse after eating, and also with "indigestion/gas" which has been a major issue for him.  He has a known elevated right hemidiaphragm dating back to 2005 on chest xray review, but this has never been evaluated with fluoro.  He has often sleep in a recliner due to sob when lying flat, and uses nocturnal oxygen.  He has a long h/o tobacco abuse, but has never had PFT's.  He denies chronic cough or congestion.  He feels he has a reasonable exertional tolerance level when not bloated.  His most recent cxr shows elevation of right HD, and now with some elevation of left HD with large quantity of gas noted under it.     Review of Systems  Constitutional: Positive for unexpected weight change. Negative for fever.  HENT: Positive for congestion and dental problem. Negative for ear pain, nosebleeds, sore throat, rhinorrhea, sneezing, trouble swallowing, postnasal drip and sinus pressure.   Eyes: Negative for redness and itching.  Respiratory: Positive for shortness of breath. Negative for cough, chest tightness and wheezing.   Cardiovascular: Negative for palpitations and leg swelling.  Gastrointestinal: Negative for nausea and vomiting.  Genitourinary: Negative for dysuria.  Musculoskeletal: Negative for joint swelling.  Skin: Negative for rash.  Neurological: Negative for headaches.  Hematological: Does not bruise/bleed easily.  Psychiatric/Behavioral: Negative for dysphoric mood. The patient is not nervous/anxious.        Objective:   Physical Exam Constitutional:  Well developed, no acute distress  HENT:  Nares patent without discharge  Oropharynx without exudate, palate and uvula are normal  Eyes:  Perrla,  eomi, no scleral icterus  Neck:  No JVD, no TMG  Cardiovascular:  Normal rate, regular rhythm, no rubs or gallops.  No murmurs        Intact distal pulses  Pulmonary :  Decreased bs in both bases, no stridor or respiratory distress   No rales, rhonchi, or wheezing  Abdominal:  Soft, nondistended, bowel sounds present.  No tenderness noted.   Musculoskeletal:  Minimal lower extremity edema noted.  Lymph Nodes:  No cervical lymphadenopathy noted  Skin:  No cyanosis noted  Neurologic:  Alert, appropriate, moves all 4 extremities without obvious deficit.         Assessment & Plan:

## 2010-11-02 NOTE — Patient Instructions (Signed)
I will call Dr. Anne Hahn to discuss your spine disease Will schedule for imaging of your diaphragms to check on function.  Will call you with results.

## 2010-11-04 ENCOUNTER — Ambulatory Visit (HOSPITAL_COMMUNITY)
Admission: RE | Admit: 2010-11-04 | Discharge: 2010-11-04 | Disposition: A | Discharge status: Home / Self Care | Payer: Medicare Other | Source: Ambulatory Visit | Attending: Pulmonary Disease | Admitting: Pulmonary Disease

## 2010-11-04 DIAGNOSIS — Z09 Encounter for follow-up examination after completed treatment for conditions other than malignant neoplasm: Secondary | ICD-10-CM | POA: Insufficient documentation

## 2010-11-04 DIAGNOSIS — R0600 Dyspnea, unspecified: Secondary | ICD-10-CM

## 2010-11-04 DIAGNOSIS — Z87891 Personal history of nicotine dependence: Secondary | ICD-10-CM | POA: Insufficient documentation

## 2010-11-05 ENCOUNTER — Telehealth: Payer: Self-pay | Admitting: Internal Medicine

## 2010-11-05 ENCOUNTER — Telehealth: Payer: Self-pay | Admitting: Pulmonary Disease

## 2010-11-05 NOTE — Telephone Encounter (Signed)
Pt had fluoro of diaphragms which show that both move.  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  He does not feel this is a major contributor to diaphragm dysfunction, but could play some role.  We have reached the conclusion will get baseline lung volumes to be able to monitor his condition, and get him back to Dr. Leone Payor to work on excess gas/colonic distention.    Megan, please get the pt set up for full pfts at his convenience over the next few weeks.  Also, pt states he is doing better with bid PPI, and needs prescription called in.  Please let Dr. Marvell Fuller nurse know so they can send in.  Thanks

## 2010-11-05 NOTE — Telephone Encounter (Signed)
Schedule PFT for 7/18 @ 12:00pm. Todd Stevens

## 2010-11-05 NOTE — Telephone Encounter (Signed)
[id number] to schedule PFT.

## 2010-11-08 MED ORDER — PANTOPRAZOLE SODIUM 40 MG PO TBEC: 40.0000 mg | DELAYED_RELEASE_TABLET | Freq: Two times a day (BID) | ORAL | Status: DC

## 2010-11-08 NOTE — Telephone Encounter (Signed)
Please change pantoprazole to 40 mg bid #60 with 3 refills Also needs REV me in 6-8 weeks

## 2010-11-08 NOTE — Telephone Encounter (Signed)
Patient advised of rx change and new rx sent to his pharmacy.  I have scheduled an office visit with him for 12/20/10

## 2010-11-08 NOTE — Telephone Encounter (Signed)
Addended by: Annett Fabian on: 11/08/2010 02:09 PM   Modules accepted: Orders

## 2010-11-10 ENCOUNTER — Encounter: Payer: Self-pay | Admitting: Pulmonary Disease

## 2010-11-10 NOTE — Assessment & Plan Note (Signed)
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  He does have significant cervical disk disease, and I wonder if nerve root compression could be playing a role here.  I am worried that he may have paralysis of the right HD, and that he may be developing issues with the left.  He will need fluoro to help evaluate this.  It may be this is all multifactorial, with nerve root compression and GI issues playing roles.  Will also call his neurologist to get his input from all of this.

## 2010-11-17 ENCOUNTER — Ambulatory Visit (INDEPENDENT_AMBULATORY_CARE_PROVIDER_SITE_OTHER): Payer: Medicare Other | Admitting: Pulmonary Disease

## 2010-11-17 DIAGNOSIS — R0600 Dyspnea, unspecified: Secondary | ICD-10-CM

## 2010-11-17 DIAGNOSIS — R0989 Other specified symptoms and signs involving the circulatory and respiratory systems: Secondary | ICD-10-CM

## 2010-11-17 LAB — PULMONARY FUNCTION TEST

## 2010-11-17 NOTE — Progress Notes (Signed)
PFT done today. 

## 2010-11-19 ENCOUNTER — Telehealth: Payer: Self-pay | Admitting: Pulmonary Disease

## 2010-11-19 DIAGNOSIS — R0600 Dyspnea, unspecified: Secondary | ICD-10-CM

## 2010-11-19 NOTE — Telephone Encounter (Signed)
Megan, please let pt know that his pfts showed moderate restriction, i.e. His lung size is smaller than it should be, which is what we expected.  The big question is why, and we are going on the assumption some of this MAY be nerve weakness, and a lot of this may be air in abdomen pushing upward.  We now have a baseline for comparison.  Will stick with original plan on working on abdomen and see how things go.

## 2010-11-24 NOTE — Telephone Encounter (Signed)
Called and spoke with pt. Informed him of PFT results and KC's recs.  Pt verbalized understanding and denied any questions.

## 2010-11-25 ENCOUNTER — Encounter: Payer: Self-pay | Admitting: Pulmonary Disease

## 2010-12-20 ENCOUNTER — Encounter: Payer: Self-pay | Admitting: Internal Medicine

## 2010-12-20 ENCOUNTER — Ambulatory Visit (INDEPENDENT_AMBULATORY_CARE_PROVIDER_SITE_OTHER): Payer: Medicare Other | Admitting: Internal Medicine

## 2010-12-20 VITALS — BP 116/60 | HR 64 | Ht 69.0 in | Wt 187.6 lb

## 2010-12-20 DIAGNOSIS — K219 Gastro-esophageal reflux disease without esophagitis: Secondary | ICD-10-CM

## 2010-12-20 DIAGNOSIS — R0600 Dyspnea, unspecified: Secondary | ICD-10-CM

## 2010-12-20 DIAGNOSIS — R0609 Other forms of dyspnea: Secondary | ICD-10-CM

## 2010-12-20 DIAGNOSIS — R142 Eructation: Secondary | ICD-10-CM

## 2010-12-20 DIAGNOSIS — R141 Gas pain: Secondary | ICD-10-CM

## 2010-12-20 NOTE — Progress Notes (Signed)
70 yo man here for follow-up of gas and bloating with associated dysnea. Trying to lose weight and that has helped reduce post-prandial dyspnea (with small portions) Still has the same problems with bloating and dyspnea after eating though perhaps a little less severe. Dr. Shelle Iron thought diaphragms could be paralyzed but fluoroscopy evaluation showed symmetrical movement. He is using twice daily PPI now also and Telford Nab tea which he thinks helps.  Is trying the gas prevention diet but it is somewhat difficult. Also added back metamucil. Defecation pattern is unchanged and ok.

## 2010-12-20 NOTE — Assessment & Plan Note (Signed)
Still having post-prandial dyspnea associated with bloating Check Upper GI series

## 2010-12-20 NOTE — Patient Instructions (Addendum)
You have been scheduled for an Upper GI Series at [facility] on 12/23/10 @ 9:00 am please arrive at 8:45 am at the Radiology Department on the 1st floor. Please have nothing to eat or drink after midnight the night before. We will contact you with results and plans.

## 2010-12-20 NOTE — Assessment & Plan Note (Signed)
Still bloating post-prandial Upper GI series to assess the position of stomach and possible relation as it sounds like diaphragms may be involved in this process Could need a repeat EGD

## 2010-12-20 NOTE — Assessment & Plan Note (Addendum)
No heartburn on bid PPI. To have UGIS to assess anatomy and some function of UGI tract.

## 2010-12-23 ENCOUNTER — Ambulatory Visit (HOSPITAL_COMMUNITY)
Admission: RE | Admit: 2010-12-23 | Discharge: 2010-12-23 | Disposition: A | Discharge status: Home / Self Care | Payer: Medicare Other | Source: Ambulatory Visit | Attending: Internal Medicine | Admitting: Internal Medicine

## 2010-12-23 DIAGNOSIS — R143 Flatulence: Secondary | ICD-10-CM | POA: Insufficient documentation

## 2010-12-23 DIAGNOSIS — K2289 Other specified disease of esophagus: Secondary | ICD-10-CM | POA: Insufficient documentation

## 2010-12-23 DIAGNOSIS — K219 Gastro-esophageal reflux disease without esophagitis: Secondary | ICD-10-CM | POA: Insufficient documentation

## 2010-12-23 DIAGNOSIS — R141 Gas pain: Secondary | ICD-10-CM | POA: Insufficient documentation

## 2010-12-23 DIAGNOSIS — K228 Other specified diseases of esophagus: Secondary | ICD-10-CM | POA: Insufficient documentation

## 2010-12-23 DIAGNOSIS — R142 Eructation: Secondary | ICD-10-CM | POA: Insufficient documentation

## 2010-12-23 NOTE — Progress Notes (Signed)
Quick Note:  No clear answer here however duodenum looked abnormal so need to schedule an EGD to evaluate  Abnormal duodenum 793.4 dx ok to use for reason ______

## 2010-12-24 ENCOUNTER — Telehealth: Payer: Self-pay

## 2010-12-24 NOTE — Telephone Encounter (Signed)
Patient is scheduled for EGD 12/30/10 he will come in for a pre-visit on Monday at 2:30

## 2010-12-24 NOTE — Telephone Encounter (Signed)
Message copied by Annett Fabian on Fri Dec 24, 2010  3:11 PM ------      Message from: Stan Head E      Created: Thu Dec 23, 2010  5:48 PM       No clear answer here however duodenum looked abnormal so need to schedule an EGD to evaluate            Abnormal duodenum 793.4  dx ok to use for reason

## 2010-12-27 ENCOUNTER — Ambulatory Visit (AMBULATORY_SURGERY_CENTER): Payer: Medicare Other | Admitting: *Deleted

## 2010-12-27 VITALS — Ht 69.0 in | Wt 187.0 lb

## 2010-12-27 DIAGNOSIS — R933 Abnormal findings on diagnostic imaging of other parts of digestive tract: Secondary | ICD-10-CM

## 2010-12-30 ENCOUNTER — Other Ambulatory Visit: Payer: Medicare Other | Admitting: Internal Medicine

## 2011-01-04 ENCOUNTER — Ambulatory Visit (AMBULATORY_SURGERY_CENTER): Payer: Medicare Other | Admitting: Internal Medicine

## 2011-01-04 ENCOUNTER — Encounter: Payer: Self-pay | Admitting: Internal Medicine

## 2011-01-04 VITALS — BP 150/75 | HR 93 | Temp 97.6°F | Resp 21 | Ht 69.0 in | Wt 180.0 lb

## 2011-01-04 DIAGNOSIS — R933 Abnormal findings on diagnostic imaging of other parts of digestive tract: Secondary | ICD-10-CM

## 2011-01-04 DIAGNOSIS — D1330 Benign neoplasm of unspecified part of small intestine: Secondary | ICD-10-CM

## 2011-01-04 MED ORDER — SODIUM CHLORIDE 0.9 % IV SOLN: 500.0000 mL | INTRAVENOUS | Status: DC

## 2011-01-04 NOTE — Progress Notes (Signed)
Assisted pt to use urinal while in procedure room prior to pt being sedated. Pt stood at side of bed, voided 200cc clear yellow urine. WHS, RN

## 2011-01-04 NOTE — Patient Instructions (Signed)
There was a possible polyp vs. normal structure (papilla) that I biopsied. It is sometimes hard to tell these apart in this region and the structure of the scope and placement of the lens makes it difficult to see the side of the intestine. I have a hard time thinking that this would be linked to your symptoms. I will let you know. The esophagus looks like it does not squeeze in a normal fashion and that may be more linked to your symptoms. I did not see a stricture or persistently narrowed or scarred area to dilate so did not. Will discuss it all after pathology results in. Iva Boop, MD, [facility]

## 2011-01-05 ENCOUNTER — Telehealth: Payer: Self-pay

## 2011-01-05 NOTE — Telephone Encounter (Signed)

## 2011-01-10 NOTE — Progress Notes (Signed)
Quick Note:  No hydrocodone 36 hours before the gastric emptying study ______

## 2011-01-10 NOTE — Progress Notes (Signed)
Quick Note:  Office: - call him and let him know no problems on the biopsies  Schedule gastric emptying study re: GERD  [facility] no letter and no recall ______

## 2011-01-11 ENCOUNTER — Telehealth: Payer: Self-pay

## 2011-01-11 DIAGNOSIS — K219 Gastro-esophageal reflux disease without esophagitis: Secondary | ICD-10-CM

## 2011-01-11 NOTE — Telephone Encounter (Signed)
Patient advised of the above.  He is scheduled for GES 01/24/11 1:00.  He is advised to hold his protonix for 24 hours prior and hydrocodone for 36 hours prior

## 2011-01-11 NOTE — Telephone Encounter (Signed)
Message copied by Annett Fabian on Tue Jan 11, 2011 11:00 AM ------      Message from: Stan Head E      Created: Mon Jan 10, 2011 10:20 PM       Office: - call him and let him know no problems on the biopsies            Schedule gastric emptying study re: GERD            [facility] no letter and no recall

## 2011-01-24 ENCOUNTER — Ambulatory Visit (HOSPITAL_COMMUNITY)
Admission: RE | Admit: 2011-01-24 | Discharge: 2011-01-24 | Disposition: A | Discharge status: Home / Self Care | Payer: Medicare Other | Source: Ambulatory Visit | Attending: Internal Medicine | Admitting: Internal Medicine

## 2011-01-24 DIAGNOSIS — R142 Eructation: Secondary | ICD-10-CM | POA: Insufficient documentation

## 2011-01-24 DIAGNOSIS — R141 Gas pain: Secondary | ICD-10-CM | POA: Insufficient documentation

## 2011-01-24 DIAGNOSIS — K219 Gastro-esophageal reflux disease without esophagitis: Secondary | ICD-10-CM | POA: Insufficient documentation

## 2011-01-24 MED ORDER — TECHNETIUM TC 99M SULFUR COLLOID
2.0000 | Freq: Once | INTRAVENOUS | Status: AC | PRN
  Administered 2011-01-24: 2 via INTRAVENOUS

## 2011-01-25 NOTE — Progress Notes (Signed)
Quick Note:  Please ask radiology for the % remaining at 1 hour also ______

## 2011-02-01 ENCOUNTER — Other Ambulatory Visit: Payer: Self-pay | Admitting: Internal Medicine

## 2011-02-01 DIAGNOSIS — R140 Abdominal distension (gaseous): Secondary | ICD-10-CM

## 2011-02-01 MED ORDER — METOCLOPRAMIDE HCL 5 MG PO TABS: ORAL_TABLET | ORAL | Status: DC

## 2011-02-01 NOTE — Progress Notes (Signed)
Quick Note:  His 1 hour emptying is slow and may be cause of his bloating problems  I prescribed metaclopramide 5 mg qac  Please let him know this and he needs to schedule follow-up in about 6 weeks  Will need to see if this helps prior to committing to long-term therapy - make him aware that sleepiness, neuropathic pain and twitching are possible side effects and he should let us know if that ocurs - we will stop med then - but given his problems it is important to try this ______

## 2011-02-01 NOTE — Assessment & Plan Note (Signed)
Trial of metaclopramide 5 mg qac for early gastric retention (1 hour)

## 2011-03-07 NOTE — Telephone Encounter (Signed)
done

## 2011-03-08 ENCOUNTER — Ambulatory Visit (INDEPENDENT_AMBULATORY_CARE_PROVIDER_SITE_OTHER): Payer: Medicare Other | Admitting: Internal Medicine

## 2011-03-08 ENCOUNTER — Encounter: Payer: Self-pay | Admitting: Internal Medicine

## 2011-03-08 DIAGNOSIS — R0989 Other specified symptoms and signs involving the circulatory and respiratory systems: Secondary | ICD-10-CM

## 2011-03-08 DIAGNOSIS — K219 Gastro-esophageal reflux disease without esophagitis: Secondary | ICD-10-CM

## 2011-03-08 DIAGNOSIS — R0600 Dyspnea, unspecified: Secondary | ICD-10-CM

## 2011-03-08 DIAGNOSIS — R140 Abdominal distension (gaseous): Secondary | ICD-10-CM

## 2011-03-08 DIAGNOSIS — R141 Gas pain: Secondary | ICD-10-CM

## 2011-03-08 MED ORDER — METOCLOPRAMIDE HCL 5 MG PO TABS: ORAL_TABLET | ORAL | Status: AC

## 2011-03-08 NOTE — Progress Notes (Signed)
[id number] 1940/08/02  Todd Stevens    Todd Stevens returns to discuss his bloating and dyspnea problems. He continues to have these despite trying metoclopramide 5 mg before meals. That did not work so he stopped it. He continues to use a probiotic though that may not be making a difference either. He is still having postprandial abdominal distention and bloating at times what he calls pressure on his diaphragms making him short of breath. He does not describe excessive flatus or bowel movement difficulty. He also indicates that he recently tried drinking water after most bites of food and he seemed to have less problems. This is clearly a postprandial problem for him overall.  Past Medical History  Diagnosis Date  . CAD (coronary artery disease)   . 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 GERD with stricture     stenosis more than stricture  . Esophageal motility disorder   . HTN (hypertension)   . HLD (hyperlipidemia)   . Diverticulosis   . Aortic valve sclerosis   . Neuropathy   . Cervical disc disease     neuropathy  . Internal hemorrhoids   . Pneumonia 01/2010    Hospitalized  . GERD (gastroesophageal reflux disease)    Past Surgical History  Procedure Date  . Coronary angioplasty with stent placement   . Hernia repair   . Tonsillectomy   . Esophagogastroduodenoscopy 01/2008    with esophageal dilation - esophageal dysmotility + stenosis, also benign fundic gland gastric polyps  . Cardiac catheterization   . Colonoscopy 08/2001    diverticulosis, internal hemorrhoids    reports that he quit smoking about 22 years ago. His smoking use included Cigarettes. He has a 30 pack-year smoking history. He has never used smokeless tobacco. He reports that he does not drink alcohol or use illicit drugs. family history includes Heart attack in his father and Heart disease in his father.  There is no history of Colon cancer and Stomach cancer. No Known Allergies    Filed Vitals:   03/08/11  1349  BP: 128/78  Pulse: 68

## 2011-03-08 NOTE — Assessment & Plan Note (Signed)
The bloating continues to cause pressure on these areas of his diaphragm he thinks it making dyspneic when he swells and distends.

## 2011-03-08 NOTE — Assessment & Plan Note (Signed)
No heartburn on daily PPI - Protonix, using daily before supper. He wants to leave Rx at bid "just in case" for now

## 2011-03-08 NOTE — Patient Instructions (Addendum)
Drink water more frequently after meals to see if that helps if not try taking 10 mg of Metoclopramide before meals as needed. If this fails to help please give Todd Stevens a call back.

## 2011-03-08 NOTE — Assessment & Plan Note (Addendum)
Still a problem. The metoclopramide 5 mg dose did not seem to make a difference. He indicates that taking a drink of water after most bites made a big difference yesterday and is encouraged by that and would like to continue that. We also discussed, after trying that again though is seems unlikely to help Will w that, to try 10 mg metoclopramide before meals to see if that made a difference. He does have what seems to be early delayed gastric emptying at 1 hour and some sort of gastric dysmotility. If this is ineffective then a tertiary referral will be offered we discussed that today. I wonder if he does not have some sort of motility disturbance. He relates much of his symptoms to being hospitalized for pneumonia being put on antibiotics intravenously.  We did discuss potential side effects of metoclopramide now are usually try to avoid using that long-term but it was worth trying the higher dose.

## 2011-03-14 ENCOUNTER — Other Ambulatory Visit: Payer: Self-pay | Admitting: Cardiology

## 2011-03-14 MED ORDER — LISINOPRIL 20 MG PO TABS: 20.0000 mg | ORAL_TABLET | Freq: Every day | ORAL | Status: DC

## 2011-04-18 ENCOUNTER — Other Ambulatory Visit: Payer: Self-pay | Admitting: Internal Medicine

## 2011-07-15 ENCOUNTER — Other Ambulatory Visit: Payer: Self-pay

## 2011-07-15 MED ORDER — LISINOPRIL 20 MG PO TABS: 20.0000 mg | ORAL_TABLET | Freq: Every day | ORAL | Status: DC

## 2011-08-29 ENCOUNTER — Other Ambulatory Visit: Payer: Self-pay | Admitting: Cardiology

## 2012-04-30 ENCOUNTER — Other Ambulatory Visit: Payer: Self-pay

## 2012-04-30 MED ORDER — PANTOPRAZOLE SODIUM 40 MG PO TBEC: 40.0000 mg | DELAYED_RELEASE_TABLET | Freq: Every day | ORAL | Status: DC

## 2012-05-18 ENCOUNTER — Other Ambulatory Visit: Payer: Self-pay | Admitting: Dermatology

## 2012-11-11 IMAGING — RF DG SNIFF TEST
5 series · 5 of 5 positions shown · non-contrast
Comparison: Chest radiograph dated 10/15/2010

***ADDENDUM*** CREATED: 11/29/2010 [DATE]

HEADING SHOULD READ DG SNIFF TEST
***END ADDENDUM*** SIGNED BY: Nicolai Pierro, M.D.
CLINICAL DATA: Elevated right hemidiaphragm, worsening elevation on
the left, ?  dysfunction or paralyzed
RADIOLOGY EXAMINATION
Fluoroscopy Time: 1.2 minutes
TECHNIQUE: Fluoroscopic images of the bilateral hemidiaphragms were
taken during inspiration, expiration, and rest.

[Series 1: run · 1 of 1 slices shown (1 of 5)]
[im 1/1]
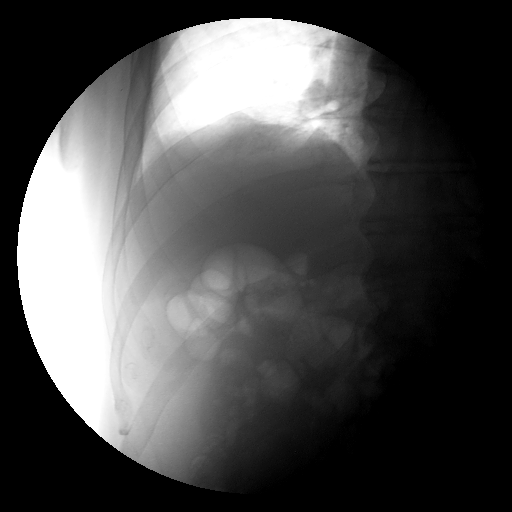

[Series 2: run · 1 of 1 slices shown (2 of 5)]
[im 1/1]
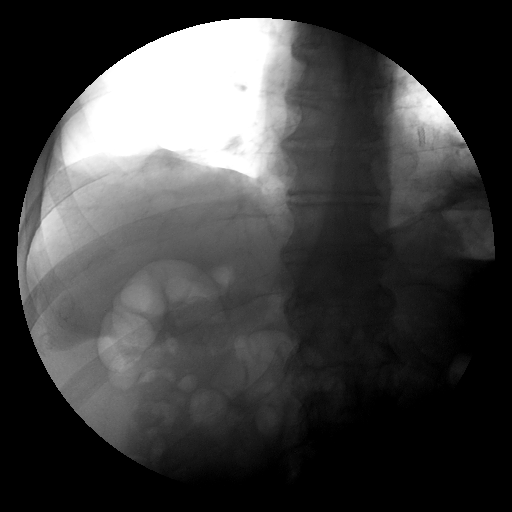

[Series 3: run · 1 of 1 slices shown (3 of 5)]
[im 1/1]
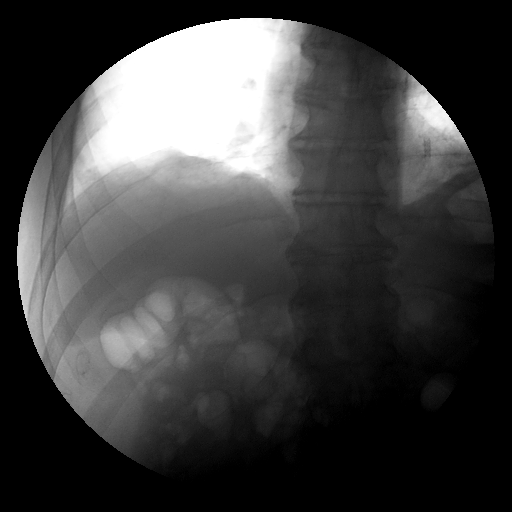

[Series 4: run · 1 of 1 slices shown (4 of 5)]
[im 1/1]
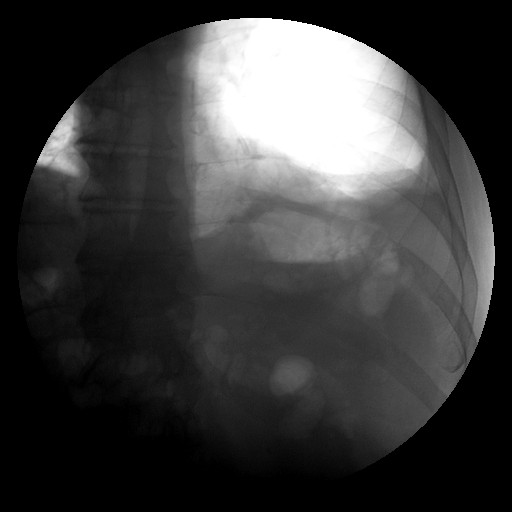

[Series 5: run · 1 of 1 slices shown (5 of 5)]
[im 1/1]
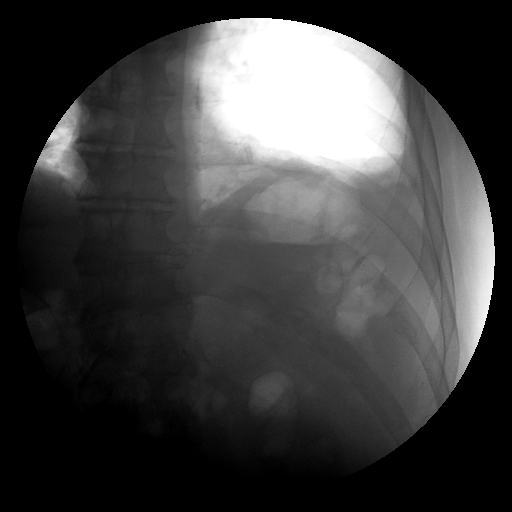

[5 of 5 positions shown; findings below may reference images not displayed]

FINDINGS: Scout imaging demonstrates mild left basilar opacity,
likely atelectasis.

Both hemidiaphragms symmetrically move down during inspiration and
up during expiration.  No abnormal motion is seen.
IMPRESSION: No evidence of diaphragmatic paralysis or abnormal motion.

## 2012-12-30 IMAGING — CR DG UGI W/ SMALL BOWEL
1 series · 1 of 1 positions shown · IV contrast (agent unspecified)
Comparison: Plain film examination 10/15/2010 [REDACTED].

CLINICAL DATA: Reflux.  Gas.

UPPER GI W/ SMALL BOWEL
TECHNIQUE: Upper GI series performed with high density barium and
effervescent agent. Thin barium also used.  Subsequently, serial
images of the small bowel were obtained including spot views of the
terminal ileum.
Fluoroscopy Time: 4.1 minutes
Contrast: Single and double contrast.

[t abdomen supine]
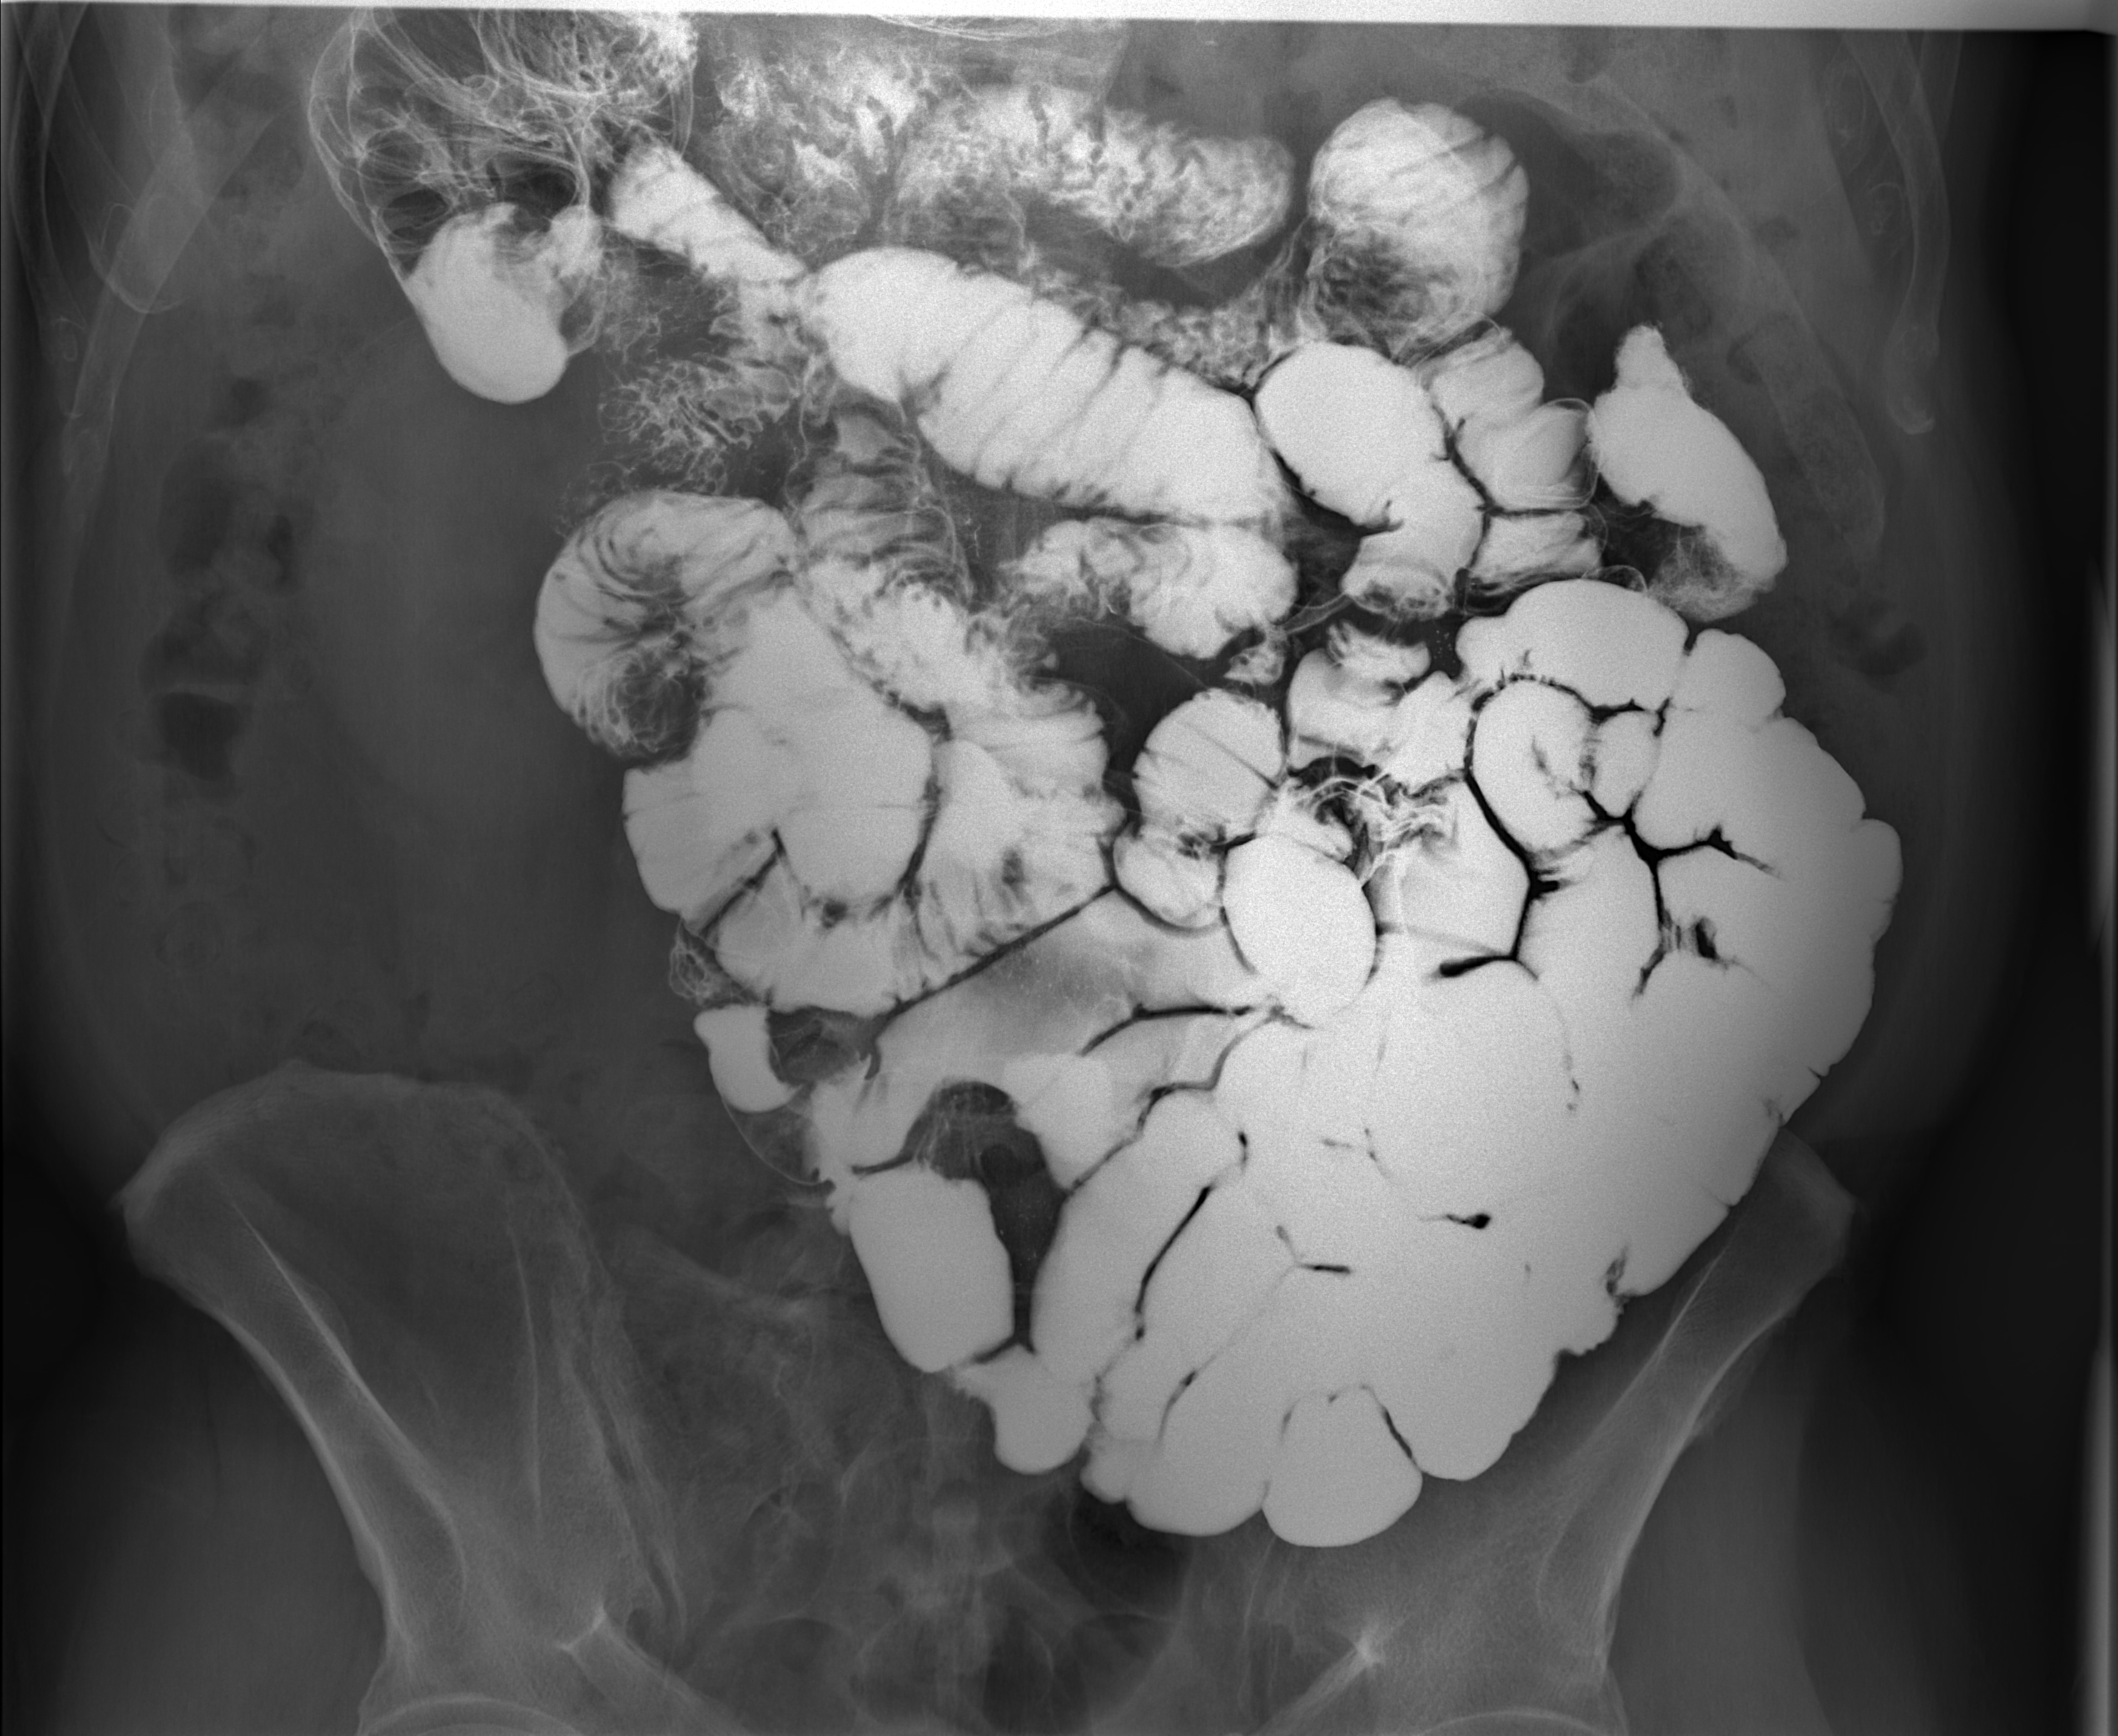

[1 of 1 positions shown; findings below may reference images not displayed]

FINDINGS: Scout view reveals vascular calcifications and lumbar
spine degenerative changes.

Presbyesophagus.

Reflux to the distal esophagus with change of patient position.

Smooth circumferential narrowing of the distal esophagus.  13 mm
barium tablet becomes lodged at this level and only traverses after
several swallows.

No gastric mass or mucosal abnormality noted.

Abnormal appearance of the proximal duodenum which was irritable
during exam with fold thickening.  Delayed transit of contrast.
Possibility of polypoid lesion is raised although this may
represent redundant mucosa.  Direct visualization would be
necessary for further evaluation.

Barium traversed throughout the small bowel entering the colon by 3
hours after beginning examination.  No small bowel obstructing or
constricting lesion.  Spot views of the terminal ileum within
normal limits.
IMPRESSION: Presbyesophagus.

Reflux to the distal esophagus with change of patient position.

Smooth circumferential narrowing of the distal esophagus.  13 mm
barium tablet becomes lodged at this level and only traverses after
several swallows.

No gastric mass or mucosal abnormality noted.

Abnormal appearance of the proximal duodenum which was irritable
during exam with fold thickening.  Delayed transit of contrast.
Possibility of polypoid lesion is raised although this may
represent redundant mucosa.  Direct visualization would be
necessary for further evaluation.

Slightly slow transit time otherwise, small bowel follow-through
within normal limits.

## 2013-02-14 ENCOUNTER — Other Ambulatory Visit: Payer: Self-pay | Admitting: Dermatology

## 2013-05-16 ENCOUNTER — Encounter: Payer: Self-pay | Admitting: Internal Medicine

## 2013-07-04 ENCOUNTER — Encounter: Payer: Medicare Other | Admitting: Internal Medicine

## 2014-01-10 ENCOUNTER — Encounter: Payer: Self-pay | Admitting: Internal Medicine

## 2014-11-18 ENCOUNTER — Encounter: Payer: Self-pay | Admitting: Cardiovascular Disease

## 2014-11-18 ENCOUNTER — Ambulatory Visit (INDEPENDENT_AMBULATORY_CARE_PROVIDER_SITE_OTHER): Payer: Medicare Other | Admitting: Cardiovascular Disease

## 2014-11-18 VITALS — BP 138/76 | HR 67 | Ht 69.0 in | Wt 208.2 lb

## 2014-11-18 DIAGNOSIS — I10 Essential (primary) hypertension: Secondary | ICD-10-CM | POA: Diagnosis not present

## 2014-11-18 DIAGNOSIS — I2510 Atherosclerotic heart disease of native coronary artery without angina pectoris: Secondary | ICD-10-CM | POA: Diagnosis not present

## 2014-11-18 NOTE — Assessment & Plan Note (Addendum)
History of hypertension blood pressure measurements at 138/76. He is on metoprolol, quinapril and hydrochlorothiazide. Continue current meds at current dosing

## 2014-11-18 NOTE — Assessment & Plan Note (Signed)
History of CAD status post angioplasty and stenting December 2000 by Dr. Addison Lank in the setting of a myocardial infarction.Todd Stevens denies chest pain. Todd Stevens is on O2 daily at bedtime. 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

## 2014-11-18 NOTE — Assessment & Plan Note (Signed)
History of hyperlipidemia on Crestor followed by his PCP 

## 2014-11-18 NOTE — Progress Notes (Signed)
11/18/2014 Todd Stevens   [birth year]  [id number]  Primary Physician Donnajean Lopes, MD Primary Cardiologist: Lorretta Harp MD Renae Gloss   HPI:  Todd Stevens is a 74 year old thin-appearing married Caucasian male with no children patient of Dr. Valetta Fuller who is referred here to be established since his cardiologist, Dr. Dola Argyle, is retiring. 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 He smoked remotely. His risk factors otherwise notable for treated hypertension and hyperlipidemia. He has had stenting back in December 2000 by Dr. Lia Foyer apparently in the setting of a myocardial infarction. 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 He is on O2 daily at bedtime. He denies chest pain.   Current Outpatient Prescriptions  Medication Sig Dispense Refill  . aspirin 81 MG tablet Take 81 mg by mouth daily.      . clorazepate (TRANXENE) 7.5 MG tablet Take 1 tablet by mouth daily as needed.  0  . CRESTOR 20 MG tablet Take 10 mg by mouth daily.  0  . esomeprazole (NEXIUM) 20 MG capsule Take 20 mg by mouth daily at 12 noon.    Marland Kitchen HYDROcodone-acetaminophen (VICODIN) 5-500 MG per tablet Take 1 tablet by mouth at bedtime as needed for pain.     . metoprolol (LOPRESSOR) 50 MG tablet Take 50 mg by mouth daily.     . nitroGLYCERIN (NITROSTAT) 0.4 MG SL tablet Place 0.4 mg under the tongue every 5 (five) minutes as needed.      Marland Kitchen PROBIOTIC CAPS Take 1 capsule by mouth daily. [employer]      . quinapril-hydrochlorothiazide (ACCURETIC) 20-12.5 MG per tablet Take 1 tablet by mouth daily.  0  . VOLTAREN 1 % GEL Apply 1 application topically 3 (three) times daily.  0   No current facility-administered medications for this visit.    No Known Allergies  History   Social History  . Marital Status: Married    Spouse Name: Gwen  . Number of Children: N/A  . Years of Education: N/A   Occupational History  . retired Freight forwarder     Social History Main Topics  . Smoking status: Former Smoker -- 1.00 packs/day for 30 years    Types: Cigarettes    Quit date: 05/02/1988  . Smokeless tobacco: Never Used  . Alcohol Use: No  . Drug Use: No  . Sexual Activity: Not on file   Other Topics Concern  . Not on file   Social History Narrative     Review of Systems: General: negative for chills, fever, night sweats or weight changes.  Cardiovascular: negative for chest pain, dyspnea on exertion, edema, orthopnea, palpitations, paroxysmal nocturnal dyspnea or shortness of breath Dermatological: negative for rash Respiratory: negative for cough or wheezing Urologic: negative for hematuria Abdominal: negative for nausea, vomiting, diarrhea, bright red blood per rectum, melena, or hematemesis Neurologic: negative for visual changes, syncope, or dizziness All other systems reviewed and are otherwise negative except as noted above.    Blood pressure 138/76, pulse 67, height 5\' 9"  (1.753 m), weight 208 lb 3.2 oz (94.439 kg).  General appearance: alert and no distress Neck: no adenopathy, no carotid bruit, no JVD, supple, symmetrical, trachea midline and thyroid not enlarged, symmetric, no tenderness/mass/nodules Lungs: clear to auscultation bilaterally Heart: regular rate and rhythm, S1, S2 normal, no murmur, click, rub or gallop Extremities: extremities normal, atraumatic, no cyanosis or edema  EKG normal sinus rhythm at 67 without ST or T-wave  changes. There were inferior Q waves noted with early R-wave transition. I personally reviewed this EKG  ASSESSMENT AND PLAN:   Essential hypertension History of hypertension blood pressure measurements at 138/76. He is on metoprolol, quinapril and hydrochlorothiazide. Continue current meds at current dosing  HYPERLIPIDEMIA-MIXED History of hyperlipidemia on Crestor followed by his PCP  Coronary atherosclerosis History of CAD status post angioplasty and stenting December 2000  by Dr. Addison Lank in the setting of a myocardial infarction.he denies chest pain. He is on O2 daily at bedtime. 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      Lorretta Harp MD FACP,FACC,FAHA, [facility] 11/18/2014 10:54 AM

## 2014-11-18 NOTE — Patient Instructions (Signed)
Your physician wants you to follow-up in: 1 year with Dr Berry. You will receive a reminder letter in the mail two months in advance. If you don't receive a letter, please call our office to schedule the follow-up appointment.  

## 2015-07-15 ENCOUNTER — Encounter: Payer: Self-pay | Admitting: Internal Medicine

## 2015-09-03 ENCOUNTER — Ambulatory Visit (INDEPENDENT_AMBULATORY_CARE_PROVIDER_SITE_OTHER): Payer: Medicare Other | Admitting: Internal Medicine

## 2015-09-03 ENCOUNTER — Encounter: Payer: Self-pay | Admitting: Internal Medicine

## 2015-09-03 VITALS — BP 130/70 | HR 68 | Ht 67.0 in | Wt 211.5 lb

## 2015-09-03 DIAGNOSIS — R1013 Epigastric pain: Secondary | ICD-10-CM | POA: Diagnosis not present

## 2015-09-03 DIAGNOSIS — Z1211 Encounter for screening for malignant neoplasm of colon: Secondary | ICD-10-CM

## 2015-09-03 NOTE — Progress Notes (Signed)
   Subjective:    Patient ID: Todd Stevens, male    DOB: 1940-07-08, 75 y.o.   MRN: [id number] Cc: colon cancer screening HPI The patient is here at direction of Dr. Philip Aspen to be considered for screening colonoscopy. 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 No active GI problems. He has had negative hemoccult tests through PCP intermittently over the years and denies any bleeding.  He reports his dyspeptic sxs are gone with the use of regular daily probiotic - I had seen him in the past for this last seen 2012.  Medications, allergies, past medical history, past surgical history, family history and social history are reviewed and updated in the EMR.   Review of Systems As above     Objective:   Physical Exam BP 130/70 mmHg  Pulse 68  Ht 5\' 7"  (1.702 m)  Wt 211 lb 8 oz (95.936 kg)  BMI 33.12 kg/m2 NAD Alert and oriented x 3 Eyes anicteric  Data: reviewed 07/2014 PCP notes and 07/06/15 CBC was NL     Assessment & Plan:   Encounter Diagnoses  Name Primary?  . Colon cancer screening Yes  . Dyspepsia    We reviewed options his age and co-morbidities make Cologuard testing a good option - and will pursue colonoscopy at hospital if + (given need for nocturnal O2)  He will continue probiotic for dyspepsia.  [id number], MD

## 2015-09-03 NOTE — Patient Instructions (Addendum)
   We have ordered a cologuard test today for you and you will be given information to read about this test.  They will call you about delivery.    I appreciate the opportunity to care for you. Gatha Mayer, MD, [facility]

## 2015-09-29 LAB — COLOGUARD: Cologuard: POSITIVE

## 2015-09-30 ENCOUNTER — Encounter: Payer: Self-pay | Admitting: Internal Medicine

## 2015-09-30 ENCOUNTER — Other Ambulatory Visit: Payer: Self-pay

## 2015-09-30 DIAGNOSIS — R195 Other fecal abnormalities: Secondary | ICD-10-CM | POA: Insufficient documentation

## 2015-09-30 NOTE — Progress Notes (Signed)
Quick Note:  + cologuard - he will need a colonoscopy w/ MAC at hospital We have discussed (see my last note) ______

## 2015-10-02 ENCOUNTER — Other Ambulatory Visit: Payer: Self-pay

## 2015-10-02 DIAGNOSIS — R195 Other fecal abnormalities: Secondary | ICD-10-CM

## 2015-10-08 ENCOUNTER — Encounter: Payer: Self-pay | Admitting: Internal Medicine

## 2015-11-23 ENCOUNTER — Ambulatory Visit (AMBULATORY_SURGERY_CENTER): Payer: Self-pay | Admitting: *Deleted

## 2015-11-23 VITALS — Ht 69.0 in | Wt 212.0 lb

## 2015-11-23 DIAGNOSIS — R195 Other fecal abnormalities: Secondary | ICD-10-CM

## 2015-11-23 NOTE — Progress Notes (Signed)
No egg or soy allergy. Pt states will have breathing problems. Damage phrenic nerve.  Will need neck support.  No diet meds.  No home O2.

## 2015-11-30 ENCOUNTER — Encounter (HOSPITAL_COMMUNITY): Payer: Self-pay | Admitting: *Deleted

## 2015-12-06 ENCOUNTER — Encounter (HOSPITAL_COMMUNITY): Payer: Self-pay | Admitting: Anesthesiology

## 2015-12-06 NOTE — Anesthesia Preprocedure Evaluation (Addendum)
Anesthesia Evaluation  Patient identified by MRN, date of birth, ID band Patient awake    Reviewed: Allergy & Precautions, NPO status , Patient's Chart, lab work & pertinent test results  Airway Mallampati: III  TM Distance: >3 FB Neck ROM: Limited    Dental no notable dental hx.    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 on medications and Pt. on home beta blockers + CAD, + Past MI ( s/p Stent and angioplasty in 2000) and + Cardiac Stents  negative cardio ROS Normal cardiovascular exam Rhythm:Regular Rate:Normal  hyperlipidemia   Neuro/Psych negative neurological ROS  negative psych ROS   GI/Hepatic negative GI ROS, Neg liver ROS, GERD  ,Esophageal strictures   Endo/Other  negative endocrine ROSObese  Renal/GU negative Renal ROS  negative genitourinary   Musculoskeletal negative musculoskeletal ROS (+) Arthritis , Osteoarthritis,    Abdominal Normal abdominal exam  (+)   Peds negative pediatric ROS (+)  Hematology negative hematology ROS (+)   Anesthesia Other Findings   Reproductive/Obstetrics negative OB ROS                            Anesthesia Physical Anesthesia Plan  ASA: III  Anesthesia Plan: MAC   Post-op Pain Management:    Induction: Intravenous  Airway Management Planned: Simple Face Mask  Additional Equipment:   Intra-op Plan:   Post-operative Plan:   Informed Consent: I have reviewed the patients History and Physical, chart, labs and discussed the procedure including the risks, benefits and alternatives for the proposed anesthesia with the patient or authorized representative who has indicated his/her understanding and acceptance.   Dental advisory given  Plan Discussed  with: CRNA and Surgeon  Anesthesia Plan Comments:         Anesthesia Quick Evaluation

## 2015-12-07 ENCOUNTER — Ambulatory Visit (HOSPITAL_COMMUNITY): Payer: Medicare Other | Admitting: Anesthesiology

## 2015-12-07 ENCOUNTER — Encounter (HOSPITAL_COMMUNITY)
Admission: RE | Disposition: A | Discharge status: Home / Self Care | Payer: Self-pay | Source: Ambulatory Visit | Attending: Internal Medicine

## 2015-12-07 ENCOUNTER — Ambulatory Visit (HOSPITAL_COMMUNITY)
Admission: RE | Admit: 2015-12-07 | Discharge: 2015-12-07 | Disposition: A | Discharge status: Home / Self Care | Payer: Medicare Other | Source: Ambulatory Visit | Attending: Internal Medicine | Admitting: Internal Medicine

## 2015-12-07 ENCOUNTER — Encounter (HOSPITAL_COMMUNITY): Payer: Self-pay

## 2015-12-07 DIAGNOSIS — N400 Enlarged prostate without lower urinary tract symptoms: Secondary | ICD-10-CM | POA: Diagnosis not present

## 2015-12-07 DIAGNOSIS — Z1211 Encounter for screening for malignant neoplasm of colon: Secondary | ICD-10-CM | POA: Insufficient documentation

## 2015-12-07 DIAGNOSIS — I2510 Atherosclerotic heart disease of native coronary artery without angina pectoris: Secondary | ICD-10-CM | POA: Diagnosis not present

## 2015-12-07 DIAGNOSIS — E785 Hyperlipidemia, unspecified: Secondary | ICD-10-CM | POA: Diagnosis not present

## 2015-12-07 DIAGNOSIS — I252 Old myocardial infarction: Secondary | ICD-10-CM | POA: Insufficient documentation

## 2015-12-07 DIAGNOSIS — Z87891 Personal history of nicotine dependence: Secondary | ICD-10-CM | POA: Diagnosis not present

## 2015-12-07 DIAGNOSIS — I10 Essential (primary) hypertension: Secondary | ICD-10-CM | POA: Diagnosis not present

## 2015-12-07 DIAGNOSIS — M1990 Unspecified osteoarthritis, unspecified site: Secondary | ICD-10-CM | POA: Insufficient documentation

## 2015-12-07 DIAGNOSIS — D122 Benign neoplasm of ascending colon: Secondary | ICD-10-CM | POA: Diagnosis not present

## 2015-12-07 DIAGNOSIS — K219 Gastro-esophageal reflux disease without esophagitis: Secondary | ICD-10-CM | POA: Insufficient documentation

## 2015-12-07 DIAGNOSIS — Z79899 Other long term (current) drug therapy: Secondary | ICD-10-CM | POA: Diagnosis not present

## 2015-12-07 DIAGNOSIS — Z7982 Long term (current) use of aspirin: Secondary | ICD-10-CM | POA: Insufficient documentation

## 2015-12-07 DIAGNOSIS — K648 Other hemorrhoids: Secondary | ICD-10-CM | POA: Diagnosis not present

## 2015-12-07 DIAGNOSIS — R195 Other fecal abnormalities: Secondary | ICD-10-CM

## 2015-12-07 DIAGNOSIS — D125 Benign neoplasm of sigmoid colon: Secondary | ICD-10-CM

## 2015-12-07 DIAGNOSIS — Z955 Presence of coronary angioplasty implant and graft: Secondary | ICD-10-CM | POA: Insufficient documentation

## 2015-12-07 HISTORY — PX: COLONOSCOPY WITH PROPOFOL: SHX5780

## 2015-12-07 HISTORY — DX: Reserved for inherently not codable concepts without codable children: IMO0001

## 2015-12-07 SURGERY — COLONOSCOPY WITH PROPOFOL
Anesthesia: Monitor Anesthesia Care

## 2015-12-07 MED ORDER — PROPOFOL 10 MG/ML IV BOLUS
INTRAVENOUS | Status: AC
  Filled 2015-12-07: qty 20

## 2015-12-07 MED ORDER — PROPOFOL 500 MG/50ML IV EMUL
INTRAVENOUS | Status: DC | PRN
  Administered 2015-12-07: 100 ug/kg/min via INTRAVENOUS

## 2015-12-07 MED ORDER — LIDOCAINE HCL (CARDIAC) 20 MG/ML IV SOLN
INTRAVENOUS | Status: AC
  Filled 2015-12-07: qty 5

## 2015-12-07 MED ORDER — PROPOFOL 500 MG/50ML IV EMUL
INTRAVENOUS | Status: DC | PRN
  Administered 2015-12-07: 20 mg via INTRAVENOUS
  Administered 2015-12-07: 50 mg via INTRAVENOUS

## 2015-12-07 MED ORDER — LACTATED RINGERS IV SOLN
INTRAVENOUS | Status: DC
  Administered 2015-12-07: 1000 mL via INTRAVENOUS

## 2015-12-07 MED ORDER — SODIUM CHLORIDE 0.9 % IV SOLN: INTRAVENOUS | Status: DC

## 2015-12-07 MED ORDER — ASPIRIN 81 MG PO TABS: 81.0000 mg | ORAL_TABLET | Freq: Every day | ORAL | Status: AC

## 2015-12-07 SURGICAL SUPPLY — 21 items

## 2015-12-07 NOTE — H&P (Signed)
[facility] History and Physical   Primary Care Physician:  Donnajean Lopes, MD   Reason for Procedure:   evaluate + Cologuard test  Plan:    Colonoscopy     The risks and benefits as well as alternatives of endoscopic procedure(s) have been discussed and reviewed. All questions answered. The patient agrees to proceed.      HPI: Todd Stevens is a 75 y.o. male with asymptomatic + cologuard test here for colonoscopy  Past Medical History:  Diagnosis Date  . Aortic valve sclerosis   . CAD (coronary artery disease)   . Cervical disc disease    neuropathy  . Diverticulosis   . Elevated hemidiaphragm    right  . Esophageal motility disorder   . GERD (gastroesophageal reflux disease)   . GERD with stricture    stenosis more than stricture  . HLD (hyperlipidemia)   . HTN (hypertension)   . Hypertension   . Internal hemorrhoids   . Myocardial infarct ([place]) 04/1999  . Neuropathy ([place])   . OA (osteoarthritis)   . Obesity   . Pneumonia 01/2010   Hospitalized  . Restless leg syndrome   . Shortness of breath dyspnea    with exertion   . Skin cancer   . Status post dilation of esophageal narrowing     Past Surgical History:  Procedure Laterality Date  . CARDIAC CATHETERIZATION    . COLONOSCOPY  08/2001   diverticulosis, internal hemorrhoids  . CORONARY ANGIOPLASTY WITH STENT PLACEMENT    . ESOPHAGOGASTRODUODENOSCOPY  01/2008   with esophageal dilation - esophageal dysmotility + stenosis, also benign fundic gland gastric polyps  . INGUINAL HERNIA REPAIR Right   . SKIN CANCER EXCISION    . TONSILLECTOMY      Prior to Admission medications   Medication Sig Start Date End Date Taking? Authorizing Provider  aspirin 81 MG tablet Take 81 mg by mouth daily.     Yes Historical Provider, MD  Carboxymethylcellulose Sodium (REFRESH PLUS OP) Place 1 drop into both eyes daily.   Yes Historical Provider, MD  clorazepate (TRANXENE) 7.5 MG tablet Take 1 tablet by  mouth daily as needed. 09/30/14  Yes Historical Provider, MD  CRESTOR 20 MG tablet Take 10 mg by mouth daily. 09/30/14  Yes Historical Provider, MD  esomeprazole (NEXIUM) 20 MG capsule Take 20 mg by mouth daily at 12 noon.   Yes Historical Provider, MD  HYDROcodone-acetaminophen (NORCO/VICODIN) 5-325 MG tablet Take 1 tablet by mouth. 1/2 tablet at bedtime and 1/2 tablet in the middle of the night as needed 07/18/15  Yes Historical Provider, MD  metoprolol (LOPRESSOR) 50 MG tablet Take 50 mg by mouth daily.    Yes Historical Provider, MD  polyethylene glycol powder (GLYCOLAX/MIRALAX) powder Take 1 Container by mouth once.   Yes Historical Provider, MD  PROBIOTIC CAPS Take 1 capsule by mouth daily. [employer]     Yes Historical Provider, MD  quinapril-hydrochlorothiazide (ACCURETIC) 20-12.5 MG per tablet Take 1 tablet by mouth daily. 10/31/14  Yes Historical Provider, MD  bisacodyl (DULCOLAX) 5 MG EC tablet Take 5 mg by mouth daily as needed for moderate constipation.    Historical Provider, MD  nitroGLYCERIN (NITROSTAT) 0.4 MG SL tablet Place 0.4 mg under the tongue every 5 (five) minutes as needed.      Historical Provider, MD  VOLTAREN 1 % GEL Apply 1 application topically as needed.  09/16/14   Historical Provider, MD    Current Facility-Administered Medications  Medication Dose Route  Frequency Provider Last Rate Last Dose  . 0.9 %  sodium chloride infusion   Intravenous Continuous Gatha Mayer, MD      . lactated ringers infusion   Intravenous Continuous Gatha Mayer, MD 50 mL/hr at 12/07/15 0810 1,000 mL at 12/07/15 0810    Allergies as of 10/02/2015  . (No Known Allergies)    Family History  Problem Relation Age of Onset  . Heart attack Father     deceased at age 38  . Heart disease Father   . Alzheimer's disease Mother   . Alzheimer's disease Maternal Aunt     x 3  . Colon cancer Neg Hx   . Stomach cancer Neg Hx     Social History   Social History  . Marital status: Married     Spouse name: Gwen  . Number of children: 0  . Years of education: N/A   Occupational History  . retired Freight forwarder Retired   Social History Main Topics  . Smoking status: Former Smoker    Packs/day: 1.00    Years: 30.00    Types: Cigarettes    Quit date: 05/02/1988  . Smokeless tobacco: Never Used  . Alcohol use 0.0 oz/week     Comment: beer every 4 months   . Drug use: No  . Sexual activity: Not on file   Other Topics Concern  . Not on file   Social History Narrative  . No narrative on file    Review of Systems: Positive for chronic neck pain All other review of systems negative except as mentioned in the HPI.  Physical Exam: Vital signs in last 24 hours: Temp:  [98 F (36.7 C)] 98 F (36.7 C) (08/07 0806) Pulse Rate:  [81] 81 (08/07 0806) BP: (156)/(92) 156/92 (08/07 0806) SpO2:  [97 %] 97 % (08/07 0806)   General:   Alert,  Well-developed, well-nourished, pleasant and cooperative in NAD Lungs:  Clear throughout to auscultation.   Heart:  Regular rate and rhythm; no murmurs, clicks, rubs,  or gallops. Abdomen:  Soft, nontender and nondistended. Normal bowel sounds.   Neuro/Psych:  Alert and cooperative. Normal mood and affect. A and O x 3   @Kendyl Bissonnette  Simonne Maffucci, MD, [facility] [phone] (pager) 12/07/2015 8:22 AM@

## 2015-12-07 NOTE — Transfer of Care (Signed)
Immediate Anesthesia Transfer of Care Note  Patient: Todd Stevens  Procedure(s) Performed: Procedure(s): COLONOSCOPY WITH PROPOFOL (N/A)  Patient Location: PACU  Anesthesia Type:MAC  Level of Consciousness: awake, alert  and oriented  Airway & Oxygen Therapy: Patient Spontanous Breathing and Patient connected to face mask oxygen  Post-op Assessment: Report given to RN and Post -op Vital signs reviewed and stable  Post vital signs: Reviewed and stable  Last Vitals:  Vitals:   12/07/15 0806  BP: (!) 156/92  Pulse: 81  Temp: 36.7 C    Last Pain:  Vitals:   12/07/15 0806  TempSrc: Oral         Complications: No apparent anesthesia complications

## 2015-12-07 NOTE — Discharge Instructions (Signed)
° °  I found and removed 2 polyps - also saw diverticulosis and internal hemorrhoids.  I will let you know pathology results and when to have another routine colonoscopy by mail. I appreciate the opportunity to care for you. Gatha Mayer, MD, FACG  YOU HAD AN ENDOSCOPIC PROCEDURE TODAY: Refer to the procedure report and other information in the discharge instructions given to you for any specific questions about what was found during the examination. If this information does not answer your questions, please call Dr. Celesta Aver office at [phone] to clarify.   YOU SHOULD EXPECT: Some feelings of bloating in the abdomen. Passage of more gas than usual. Walking can help get rid of the air that was put into your GI tract during the procedure and reduce the bloating. If you had a lower endoscopy (such as a colonoscopy or flexible sigmoidoscopy) you may notice spotting of blood in your stool or on the toilet paper. Some abdominal soreness may be present for a day or two, also.  DIET: Your first meal following the procedure should be a light meal and then it is ok to progress to your normal diet. A half-sandwich or bowl of soup is an example of a good first meal. Heavy or fried foods are harder to digest and may make you feel nauseous or bloated. Drink plenty of fluids but you should avoid alcoholic beverages for 24 hours.   ACTIVITY: Your care partner should take you home directly after the procedure. You should plan to take it easy, moving slowly for the rest of the day. You can resume normal activity the day after the procedure however YOU SHOULD NOT DRIVE, use power tools, machinery or perform tasks that involve climbing or major physical exertion for 24 hours (because of the sedation medicines used during the test).   SYMPTOMS TO REPORT IMMEDIATELY: A gastroenterologist can be reached at any hour. Please call [phone]  for any of the following symptoms:  Following lower endoscopy  (colonoscopy, flexible sigmoidoscopy) Excessive amounts of blood in the stool  Significant tenderness, worsening of abdominal pains  Swelling of the abdomen that is new, acute  Fever of 100 or higher  Following upper endoscopy (EGD, EUS, ERCP, esophageal dilation) Vomiting of blood or coffee ground material  New, significant abdominal pain  New, significant chest pain or pain under the shoulder blades  Painful or persistently difficult swallowing  New shortness of breath  Black, tarry-looking or red, bloody stools  FOLLOW UP:  If any biopsies were taken you will be contacted by phone or by letter within the next 1-3 weeks. Call [phone]  if you have not heard about the biopsies in 3 weeks.  Please also call with any specific questions about appointments or follow up tests.

## 2015-12-07 NOTE — Anesthesia Postprocedure Evaluation (Signed)
Anesthesia Post Note  Patient: Todd Stevens  Procedure(s) Performed: Procedure(s) (LRB): COLONOSCOPY WITH PROPOFOL (N/A)  Patient location during evaluation: PACU Anesthesia Type: MAC Level of consciousness: awake and alert Pain management: pain level controlled Vital Signs Assessment: post-procedure vital signs reviewed and stable Respiratory status: spontaneous breathing, nonlabored ventilation, respiratory function stable and patient connected to nasal cannula oxygen Cardiovascular status: stable and blood pressure returned to baseline Anesthetic complications: no    Last Vitals:  Vitals:   12/07/15 0901 12/07/15 0920  BP: (!) 153/69 (!) 144/61  Pulse: 77   Resp: (!) 25   Temp: 36.5 C     Last Pain:  Vitals:   12/07/15 0901  TempSrc: Oral                 Reginal Lutes

## 2015-12-07 NOTE — Op Note (Addendum)
[facility] Patient Name: Todd Stevens Procedure Date: 12/07/2015 MRN: [id number] Attending MD: Gatha Mayer , MD Date of Birth: [birth year] CSN: [id number] Age: 75 Admit Type: Outpatient Procedure:                Colonoscopy Indications:              Evaluation of unexplained GI bleeding, Positive                            Cologuard test Providers:                Gatha Mayer, MD, Vista Lawman, RN, Cletis Athens,                            Technician Referring MD:              Medicines:                Propofol per Anesthesia, Monitored Anesthesia Care Complications:            No immediate complications. Estimated Blood Loss:     Estimated blood loss was minimal. Procedure:                Pre-Anesthesia Assessment:                           - Prior to the procedure, a History and Physical                            was performed, and patient medications and                            allergies were reviewed. The patient's tolerance of                            previous anesthesia was also reviewed. The risks                            and benefits of the procedure and the sedation                            options and risks were discussed with the patient.                            All questions were answered, and informed consent                            was obtained. Prior Anticoagulants: The patient                            last took aspirin 3 days prior to the procedure.                            ASA Grade Assessment: III - A patient with severe  systemic disease. After reviewing the risks and                            benefits, the patient was deemed in satisfactory                            condition to undergo the procedure.                           After obtaining informed consent, the colonoscope                            was passed under direct vision. Throughout the                            procedure, the  patient's blood pressure, pulse, and                            oxygen saturations were monitored continuously. The                            [id number] scope was introduced through                            the anus and advanced to the the cecum, identified                            by appendiceal orifice and ileocecal valve. The                            colonoscopy was performed without difficulty. The                            patient tolerated the procedure well. The quality                            of the bowel preparation was adequate. The quality                            of the bowel preparation was adequate. The                            ileocecal valve, appendiceal orifice, and rectum                            were photographed. Scope In: 8:34:53 AM Scope Out: 8:53:36 AM Scope Withdrawal Time: 0 hours 14 minutes 7 seconds  Total Procedure Duration: 0 hours 18 minutes 43 seconds  Findings:      The perianal examination was normal.      The digital rectal exam findings include enlarged prostate. Pertinent       negatives include no palpable rectal lesions.      A 15 mm polyp was found in the sigmoid colon. The polyp was       pedunculated. The polyp  was removed with a hot snare. Resection and       retrieval were complete. Verification of patient identification for the       specimen was done. Estimated blood loss: none.      A 6 mm polyp was found in the ascending colon. The polyp was sessile.       The polyp was removed with a cold snare. Resection was complete, but the       polyp tissue was not retrieved.      Multiple diverticula were found in the entire colon. There was no       evidence of diverticular bleeding.      Internal hemorrhoids were found during retroflexion.      The exam was otherwise without abnormality on direct and retroflexion       views. Impression:               - Enlarged prostate found on digital rectal exam.                            - One 15 mm polyp in the sigmoid colon, removed                            with a hot snare. Resected and retrieved.                           - One 6 mm polyp in the ascending colon, removed                            with a cold snare. Complete resection. Polyp tissue                            not retrieved.                           - Moderate diverticulosis in the entire examined                            colon. There was no evidence of diverticular                            bleeding.                           - Internal hemorrhoids.                           - The examination was otherwise normal on direct                            and retroflexion views. Moderate Sedation:      Please see anesthesia notes, moderate sedation not given Recommendation:           - Patient has a contact number available for                            emergencies. The signs and symptoms of potential  delayed complications were discussed with the                            patient. Return to normal activities tomorrow.                            Written discharge instructions were provided to the                            patient.                           - Resume previous diet.                           - Continue present medications.                           - No aspirin, ibuprofen, naproxen, or other                            non-steroidal anti-inflammatory drugs for 2 weeks                            after polyp removal.                           - Repeat colonoscopy is recommended. The                            colonoscopy date will be determined after pathology                            results from today's exam become available for                            review. Procedure Code(s):        --- Professional ---                           [phone], Colonoscopy, flexible; with removal of                            tumor(s), polyp(s), or other lesion(s) by snare                             technique Diagnosis Code(s):        --- Professional ---                           D12.5, Benign neoplasm of sigmoid colon                           D12.2, Benign neoplasm of ascending colon                           K64.8, Other hemorrhoids  K92.2, Gastrointestinal hemorrhage, unspecified                           R19.5, Other fecal abnormalities                           N40.0, Benign prostatic hyperplasia without lower                            urinary tract symptoms                           K57.30, Diverticulosis of large intestine without                            perforation or abscess without bleeding CPT copyright 2016 American [employer] Association. All rights reserved. The codes documented in this report are preliminary and upon coder review may  be revised to meet current compliance requirements. Gatha Mayer, MD 12/07/2015 9:10:17 AM This report has been signed electronically. Number of Addenda: 1 Addendum Number: 1   Addendum Date: 12/07/2015 9:10:34 AM      cc: Dr. Janie Morning Gatha Mayer, MD 12/07/2015 9:10:47 AM This report has been signed electronically.

## 2015-12-08 ENCOUNTER — Encounter (HOSPITAL_COMMUNITY): Payer: Self-pay | Admitting: Internal Medicine

## 2015-12-15 NOTE — Progress Notes (Signed)
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

## 2015-12-16 NOTE — Progress Notes (Signed)
That day would be 8/24!

## 2015-12-16 NOTE — Progress Notes (Signed)
Will see if could do a flex sig to follow 1 PM hospital case that day - mod sedation ok and 2 enemas that AM to prep

## 2015-12-17 ENCOUNTER — Other Ambulatory Visit: Payer: Self-pay

## 2015-12-17 DIAGNOSIS — Z86010 Personal history of colonic polyps: Secondary | ICD-10-CM

## 2015-12-24 ENCOUNTER — Encounter (HOSPITAL_COMMUNITY)
Admission: RE | Disposition: A | Discharge status: Home / Self Care | Payer: Self-pay | Source: Ambulatory Visit | Attending: Internal Medicine

## 2015-12-24 ENCOUNTER — Encounter (HOSPITAL_COMMUNITY): Payer: Self-pay | Admitting: *Deleted

## 2015-12-24 ENCOUNTER — Ambulatory Visit (HOSPITAL_COMMUNITY)
Admission: RE | Admit: 2015-12-24 | Discharge: 2015-12-24 | Disposition: A | Discharge status: Home / Self Care | Payer: Medicare Other | Source: Ambulatory Visit | Attending: Internal Medicine | Admitting: Internal Medicine

## 2015-12-24 DIAGNOSIS — I10 Essential (primary) hypertension: Secondary | ICD-10-CM | POA: Diagnosis not present

## 2015-12-24 DIAGNOSIS — D374 Neoplasm of uncertain behavior of colon: Secondary | ICD-10-CM | POA: Diagnosis not present

## 2015-12-24 DIAGNOSIS — K5730 Diverticulosis of large intestine without perforation or abscess without bleeding: Secondary | ICD-10-CM | POA: Diagnosis not present

## 2015-12-24 DIAGNOSIS — I252 Old myocardial infarction: Secondary | ICD-10-CM | POA: Diagnosis not present

## 2015-12-24 DIAGNOSIS — E785 Hyperlipidemia, unspecified: Secondary | ICD-10-CM | POA: Insufficient documentation

## 2015-12-24 DIAGNOSIS — Z955 Presence of coronary angioplasty implant and graft: Secondary | ICD-10-CM | POA: Insufficient documentation

## 2015-12-24 DIAGNOSIS — E669 Obesity, unspecified: Secondary | ICD-10-CM | POA: Diagnosis not present

## 2015-12-24 DIAGNOSIS — Z7982 Long term (current) use of aspirin: Secondary | ICD-10-CM | POA: Insufficient documentation

## 2015-12-24 DIAGNOSIS — R195 Other fecal abnormalities: Secondary | ICD-10-CM | POA: Insufficient documentation

## 2015-12-24 DIAGNOSIS — Z8603 Personal history of neoplasm of uncertain behavior: Secondary | ICD-10-CM | POA: Diagnosis not present

## 2015-12-24 DIAGNOSIS — G629 Polyneuropathy, unspecified: Secondary | ICD-10-CM | POA: Diagnosis not present

## 2015-12-24 DIAGNOSIS — I2510 Atherosclerotic heart disease of native coronary artery without angina pectoris: Secondary | ICD-10-CM | POA: Insufficient documentation

## 2015-12-24 DIAGNOSIS — Z85828 Personal history of other malignant neoplasm of skin: Secondary | ICD-10-CM | POA: Insufficient documentation

## 2015-12-24 DIAGNOSIS — K635 Polyp of colon: Secondary | ICD-10-CM | POA: Diagnosis not present

## 2015-12-24 DIAGNOSIS — Z6831 Body mass index (BMI) 31.0-31.9, adult: Secondary | ICD-10-CM | POA: Diagnosis not present

## 2015-12-24 DIAGNOSIS — K633 Ulcer of intestine: Secondary | ICD-10-CM | POA: Diagnosis not present

## 2015-12-24 DIAGNOSIS — Z79899 Other long term (current) drug therapy: Secondary | ICD-10-CM | POA: Diagnosis not present

## 2015-12-24 DIAGNOSIS — Z86010 Personal history of colonic polyps: Secondary | ICD-10-CM | POA: Insufficient documentation

## 2015-12-24 DIAGNOSIS — Z87891 Personal history of nicotine dependence: Secondary | ICD-10-CM | POA: Diagnosis not present

## 2015-12-24 HISTORY — PX: FLEXIBLE SIGMOIDOSCOPY: SHX5431

## 2015-12-24 SURGERY — SIGMOIDOSCOPY, FLEXIBLE
Anesthesia: Moderate Sedation

## 2015-12-24 MED ORDER — SPOT INK MARKER SYRINGE KIT
PACK | SUBMUCOSAL | Status: AC
  Filled 2015-12-24: qty 5

## 2015-12-24 MED ORDER — FENTANYL CITRATE (PF) 100 MCG/2ML IJ SOLN
INTRAMUSCULAR | Status: DC | PRN
  Administered 2015-12-24 (×2): 25 ug via INTRAVENOUS

## 2015-12-24 MED ORDER — SODIUM CHLORIDE 0.9 % IV SOLN: INTRAVENOUS | Status: DC

## 2015-12-24 MED ORDER — SPOT INK MARKER SYRINGE KIT
PACK | SUBMUCOSAL | Status: DC | PRN
  Administered 2015-12-24: 4 mL via SUBMUCOSAL

## 2015-12-24 MED ORDER — MIDAZOLAM HCL 5 MG/ML IJ SOLN
INTRAMUSCULAR | Status: AC
  Filled 2015-12-24: qty 2

## 2015-12-24 MED ORDER — MIDAZOLAM HCL 10 MG/2ML IJ SOLN
INTRAMUSCULAR | Status: DC | PRN
  Administered 2015-12-24 (×3): 2 mg via INTRAVENOUS

## 2015-12-24 MED ORDER — DIPHENHYDRAMINE HCL 50 MG/ML IJ SOLN
INTRAMUSCULAR | Status: AC
  Filled 2015-12-24: qty 1

## 2015-12-24 MED ORDER — FENTANYL CITRATE (PF) 100 MCG/2ML IJ SOLN
INTRAMUSCULAR | Status: AC
  Filled 2015-12-24: qty 2

## 2015-12-24 NOTE — Progress Notes (Signed)
At the end of the prodedure , it was realized that the consent was not signed by the patient. Patient was awake during  The timeout and stated his DOB and gave [place] his verbal consent. Dr. Carlean Purl made aware the consent form not signed. Pt made aware that he did not sign the consent form before the procedure. Pt stated that he understood and would sign the consent form now, and that  that he agreed to do the procedure.

## 2015-12-24 NOTE — Discharge Instructions (Signed)
YOU HAD AN ENDOSCOPIC PROCEDURE TODAY: Refer to the procedure report and other information in the discharge instructions given to you for any specific questions about what was found during the examination. If this information does not answer your questions, please call Dr. Celesta Aver office at [phone] to clarify.   YOU SHOULD EXPECT: Some feelings of bloating in the abdomen. Passage of more gas than usual. Walking can help get rid of the air that was put into your GI tract during the procedure and reduce the bloating. If you had a lower endoscopy (such as a colonoscopy or flexible sigmoidoscopy) you may notice spotting of blood in your stool or on the toilet paper. Some abdominal soreness may be present for a day or two, also.  DIET: Your first meal following the procedure should be a light meal and then it is ok to progress to your normal diet. A half-sandwich or bowl of soup is an example of a good first meal. Heavy or fried foods are harder to digest and may make you feel nauseous or bloated. Drink plenty of fluids but you should avoid alcoholic beverages for 24 hours.   ACTIVITY: Your care partner should take you home directly after the procedure. You should plan to take it easy, moving slowly for the rest of the day. You can resume normal activity the day after the procedure however YOU SHOULD NOT DRIVE, use power tools, machinery or perform tasks that involve climbing or major physical exertion for 24 hours (because of the sedation medicines used during the test).   SYMPTOMS TO REPORT IMMEDIATELY: A gastroenterologist can be reached at any hour. Please call [phone]  for any of the following symptoms:  Following lower endoscopy (colonoscopy, flexible sigmoidoscopy) Excessive amounts of blood in the stool  Significant tenderness, worsening of abdominal pains  Swelling of the abdomen that is new, acute  Fever of 100 or higher  Following upper endoscopy (EGD, EUS, ERCP, esophageal  dilation) Vomiting of blood or coffee ground material  New, significant abdominal pain  New, significant chest pain or pain under the shoulder blades  Painful or persistently difficult swallowing  New shortness of breath  Black, tarry-looking or red, bloody stools  FOLLOW UP:  If any biopsies were taken you will be contacted by phone or by letter within the next 1-3 weeks. Call [phone]  if you have not heard about the biopsies in 3 weeks.  Please also call with any specific questions about appointments or follow up tests. YOU HAD AN ENDOSCOPIC PROCEDURE TODAY: Refer to the procedure report and other information in the discharge instructions given to you for any specific questions about what was found during the examination. If this information does not answer your questions, please call Dr. Celesta Aver office at [phone] to clarify.   YOU SHOULD EXPECT: Some feelings of bloating in the abdomen. Passage of more gas than usual. Walking can help get rid of the air that was put into your GI tract during the procedure and reduce the bloating. If you had a lower endoscopy (such as a colonoscopy or flexible sigmoidoscopy) you may notice spotting of blood in your stool or on the toilet paper. Some abdominal soreness may be present for a day or two, also.  DIET: Your first meal following the procedure should be a light meal and then it is ok to progress to your normal diet. A half-sandwich or bowl of soup is an example of a good first meal. Heavy or fried foods are harder to  digest and may make you feel nauseous or bloated. Drink plenty of fluids but you should avoid alcoholic beverages for 24 hours.   ACTIVITY: Your care partner should take you home directly after the procedure. You should plan to take it easy, moving slowly for the rest of the day. You can resume normal activity the day after the procedure however YOU SHOULD NOT DRIVE, use power tools, machinery or perform tasks that involve  climbing or major physical exertion for 24 hours (because of the sedation medicines used during the test).   SYMPTOMS TO REPORT IMMEDIATELY: A gastroenterologist can be reached at any hour. Please call [phone]  for any of the following symptoms:  Following lower endoscopy (colonoscopy, flexible sigmoidoscopy) Excessive amounts of blood in the stool  Significant tenderness, worsening of abdominal pains  Swelling of the abdomen that is new, acute  Fever of 100 or higher  Following upper endoscopy (EGD, EUS, ERCP, esophageal dilation) Vomiting of blood or coffee ground material  New, significant abdominal pain  New, significant chest pain or pain under the shoulder blades  Painful or persistently difficult swallowing  New shortness of breath  Black, tarry-looking or red, bloody stools  FOLLOW UP:  If any biopsies were taken you will be contacted by phone or by letter within the next 1-3 weeks. Call [phone]  if you have not heard about the biopsies in 3 weeks.  Please also call with any specific questions about appointments or follow up tests.

## 2015-12-24 NOTE — H&P (View-Only) (Signed)
[facility] History and Physical   Primary Care Physician:  Donnajean Lopes, MD   Reason for Procedure:   evaluate + Cologuard test  Plan:    Colonoscopy     The risks and benefits as well as alternatives of endoscopic procedure(s) have been discussed and reviewed. All questions answered. The patient agrees to proceed.      HPI: DERREN Stevens is a 75 y.o. male with asymptomatic + cologuard test here for colonoscopy  Past Medical History:  Diagnosis Date  . Aortic valve sclerosis   . CAD (coronary artery disease)   . Cervical disc disease    neuropathy  . Diverticulosis   . Elevated hemidiaphragm    right  . Esophageal motility disorder   . GERD (gastroesophageal reflux disease)   . GERD with stricture    stenosis more than stricture  . HLD (hyperlipidemia)   . HTN (hypertension)   . Hypertension   . Internal hemorrhoids   . Myocardial infarct ([place]) 04/1999  . Neuropathy ([place])   . OA (osteoarthritis)   . Obesity   . Pneumonia 01/2010   Hospitalized  . Restless leg syndrome   . Shortness of breath dyspnea    with exertion   . Skin cancer   . Status post dilation of esophageal narrowing     Past Surgical History:  Procedure Laterality Date  . CARDIAC CATHETERIZATION    . COLONOSCOPY  08/2001   diverticulosis, internal hemorrhoids  . CORONARY ANGIOPLASTY WITH STENT PLACEMENT    . ESOPHAGOGASTRODUODENOSCOPY  01/2008   with esophageal dilation - esophageal dysmotility + stenosis, also benign fundic gland gastric polyps  . INGUINAL HERNIA REPAIR Right   . SKIN CANCER EXCISION    . TONSILLECTOMY      Prior to Admission medications   Medication Sig Start Date End Date Taking? Authorizing Provider  aspirin 81 MG tablet Take 81 mg by mouth daily.     Yes Historical Provider, MD  Carboxymethylcellulose Sodium (REFRESH PLUS OP) Place 1 drop into both eyes daily.   Yes Historical Provider, MD  clorazepate (TRANXENE) 7.5 MG tablet Take 1 tablet by  mouth daily as needed. 09/30/14  Yes Historical Provider, MD  CRESTOR 20 MG tablet Take 10 mg by mouth daily. 09/30/14  Yes Historical Provider, MD  esomeprazole (NEXIUM) 20 MG capsule Take 20 mg by mouth daily at 12 noon.   Yes Historical Provider, MD  HYDROcodone-acetaminophen (NORCO/VICODIN) 5-325 MG tablet Take 1 tablet by mouth. 1/2 tablet at bedtime and 1/2 tablet in the middle of the night as needed 07/18/15  Yes Historical Provider, MD  metoprolol (LOPRESSOR) 50 MG tablet Take 50 mg by mouth daily.    Yes Historical Provider, MD  polyethylene glycol powder (GLYCOLAX/MIRALAX) powder Take 1 Container by mouth once.   Yes Historical Provider, MD  PROBIOTIC CAPS Take 1 capsule by mouth daily. [employer]     Yes Historical Provider, MD  quinapril-hydrochlorothiazide (ACCURETIC) 20-12.5 MG per tablet Take 1 tablet by mouth daily. 10/31/14  Yes Historical Provider, MD  bisacodyl (DULCOLAX) 5 MG EC tablet Take 5 mg by mouth daily as needed for moderate constipation.    Historical Provider, MD  nitroGLYCERIN (NITROSTAT) 0.4 MG SL tablet Place 0.4 mg under the tongue every 5 (five) minutes as needed.      Historical Provider, MD  VOLTAREN 1 % GEL Apply 1 application topically as needed.  09/16/14   Historical Provider, MD    Current Facility-Administered Medications  Medication Dose Route  Frequency Provider Last Rate Last Dose  . 0.9 %  sodium chloride infusion   Intravenous Continuous Gatha Mayer, MD      . lactated ringers infusion   Intravenous Continuous Gatha Mayer, MD 50 mL/hr at 12/07/15 0810 1,000 mL at 12/07/15 0810    Allergies as of 10/02/2015  . (No Known Allergies)    Family History  Problem Relation Age of Onset  . Heart attack Father     deceased at age 42  . Heart disease Father   . Alzheimer's disease Mother   . Alzheimer's disease Maternal Aunt     x 3  . Colon cancer Neg Hx   . Stomach cancer Neg Hx     Social History   Social History  . Marital status: Married     Spouse name: Gwen  . Number of children: 0  . Years of education: N/A   Occupational History  . retired Freight forwarder Retired   Social History Main Topics  . Smoking status: Former Smoker    Packs/day: 1.00    Years: 30.00    Types: Cigarettes    Quit date: 05/02/1988  . Smokeless tobacco: Never Used  . Alcohol use 0.0 oz/week     Comment: beer every 4 months   . Drug use: No  . Sexual activity: Not on file   Other Topics Concern  . Not on file   Social History Narrative  . No narrative on file    Review of Systems: Positive for chronic neck pain All other review of systems negative except as mentioned in the HPI.  Physical Exam: Vital signs in last 24 hours: Temp:  [98 F (36.7 C)] 98 F (36.7 C) (08/07 0806) Pulse Rate:  [81] 81 (08/07 0806) BP: (156)/(92) 156/92 (08/07 0806) SpO2:  [97 %] 97 % (08/07 0806)   General:   Alert,  Well-developed, well-nourished, pleasant and cooperative in NAD Lungs:  Clear throughout to auscultation.   Heart:  Regular rate and rhythm; no murmurs, clicks, rubs,  or gallops. Abdomen:  Soft, nontender and nondistended. Normal bowel sounds.   Neuro/Psych:  Alert and cooperative. Normal mood and affect. A and O x 3   @Michelina Mexicano  Simonne Maffucci, MD, [facility] [phone] (pager) 12/07/2015 8:22 AM@

## 2015-12-24 NOTE — Interval H&P Note (Signed)
History and Physical Interval Note:  12/24/2015 1:48 PM  Todd Stevens  has presented today for surgery, with the diagnosis of colon polyp  The various methods of treatment have been discussed with the patient and family. After consideration of risks, benefits and other options for treatment, the patient has consented to  Procedure(s): FLEXIBLE SIGMOIDOSCOPY (N/A) as a surgical intervention .  The patient's history has been reviewed, patient examined, no change in status, stable for surgery.  I have reviewed the patient's chart and labs.  Questions were answered to the patient's satisfaction.     Silvano Rusk

## 2015-12-24 NOTE — Op Note (Signed)
[facility] Patient Name: Todd Stevens Procedure Date: 12/24/2015 MRN: [id number] Attending MD: Gatha Mayer , MD Date of Birth: November 01, 1940 CSN: [id number] Age: 75 Admit Type: Inpatient Procedure:                Flexible Sigmoidoscopy Indications:              Colon polyps of uncertain behavior, Follow-up for                            history of colon polyps of uncertain behavior Providers:                Gatha Mayer, MD, Elmer Ramp. Tilden Dome, RN, Despina Pole Tech, Technician Referring MD:              Medicines:                Fentanyl 50 micrograms IV, Midazolam 6 mg IV Complications:            No immediate complications. Estimated blood loss:                            Minimal. Estimated Blood Loss:     Estimated blood loss was minimal. Procedure:                Pre-Anesthesia Assessment:                           - Prior to the procedure, a History and Physical                            was performed, and patient medications and                            allergies were reviewed. The patient's tolerance of                            previous anesthesia was also reviewed. The risks                            and benefits of the procedure and the sedation                            options and risks were discussed with the patient.                            All questions were answered, and informed consent                            was obtained. Prior Anticoagulants: The patient                            last took aspirin 10 days prior to the procedure.  ASA Grade Assessment: III - A patient with severe                            systemic disease. After reviewing the risks and                            benefits, the patient was deemed in satisfactory                            condition to undergo the procedure.                           After obtaining informed consent, the scope was          passed under direct vision. The EC-[id number]                            scope was introduced through the anus and advanced                            to the the sigmoid colon. Scope In: Scope Out: Findings:      The perianal and digital rectal examinations were normal.      A single (solitary) three mm ulcer was found in the distal sigmoid       colon. Polypectomy site. No bleeding was present. No stigmata of recent       bleeding were seen. Biopsies were taken with a cold forceps for       histology. Verification of patient identification for the specimen was       done. Estimated blood loss was minimal.      Diverticula were found in the sigmoid colon.      The exam was otherwise without abnormality.      An area in the distal sigmoid colon was tattooed with an injection of       Spot (carbon black). Impression:               - A single (solitary) ulcer in the distal sigmoid                            colon. Biopsied.                           - Moderate diverticulosis in the sigmoid colon.                           - The examination was otherwise normal. Moderate Sedation:      Moderate (conscious) sedation was administered by the endoscopy nurse       and supervised by the endoscopist. The following parameters were       monitored: oxygen saturation, heart rate, blood pressure, and response       to care. Total physician intraservice time was 10 minutes. Recommendation:           - Patient has a contact number available for                            emergencies. The signs and  symptoms of potential                            delayed complications were discussed with the                            patient. Return to normal activities tomorrow.                            Written discharge instructions were provided to the                            patient.                           - Await pathology results. Procedure Code(s):        --- Professional ---                            [phone], Sigmoidoscopy, flexible; with biopsy, single                            or multiple                           [id number], Sigmoidoscopy, flexible; with directed                            submucosal injection(s), any substance                           G0500, Moderate sedation services provided by the                            same physician or other qualified health care                            professional performing a gastrointestinal                            endoscopic service that sedation supports,                            requiring the presence of an independent trained                            observer to assist in the monitoring of the                            patient's level of consciousness and physiological                            status; initial 15 minutes of intra-service time;                            patient age 21 years or older (additional time 8  be reported with [phone], as appropriate) Diagnosis Code(s):        --- Professional ---                           K63.3, Ulcer of intestine                           D37.4, Neoplasm of uncertain behavior of colon                           Z86.03, Personal history of neoplasm of uncertain                            behavior                           K57.30, Diverticulosis of large intestine without                            perforation or abscess without bleeding CPT copyright 2016 [employer]. All rights reserved. The codes documented in this report are preliminary and upon coder review may  be revised to meet current compliance requirements. Gatha Mayer, MD 12/24/2015 2:50:35 PM This report has been signed electronically. Number of Addenda: 0

## 2015-12-25 ENCOUNTER — Encounter (HOSPITAL_COMMUNITY): Payer: Self-pay | Admitting: Internal Medicine

## 2015-12-28 NOTE — Progress Notes (Signed)
Please let him know no residual pre-cancerous polyp tissue Colonoscopy recall 1 year

## 2016-12-01 ENCOUNTER — Encounter: Payer: Self-pay | Admitting: Internal Medicine

## 2017-01-09 ENCOUNTER — Telehealth: Payer: Self-pay | Admitting: *Deleted

## 2017-01-09 NOTE — Telephone Encounter (Signed)
Since he is on home oxygen he needs to be done at the hospital. Thanks

## 2017-01-09 NOTE — Telephone Encounter (Signed)
Patient is for 1 year recall colonoscopy for polyps on 02/03/17 at [facility]. Last colon 12/07/15 at [facility] and flex sig. 12/24/15 at [facility]. Spoke with patient he states he is on oxygen at night 2 l/m [place] and occ. Uses this during the day. Is patient okay for [place] or direct hospital or OV? Please advise. Thank you, Linday Rhodes pv

## 2017-01-10 NOTE — Telephone Encounter (Signed)
Phoned pt and informed him his colonoscopy will need to be done at the hospital. He stated understanding. Informed him appointment would not be made until closer to his PV appt and we would inform him of date and time then. He is agreeable.

## 2017-01-11 ENCOUNTER — Other Ambulatory Visit: Payer: Self-pay

## 2017-01-11 DIAGNOSIS — Z1211 Encounter for screening for malignant neoplasm of colon: Secondary | ICD-10-CM

## 2017-01-11 NOTE — Telephone Encounter (Signed)
Patient is scheduled at [facility] on 02/20/17 8:30 .  He will need to arrive at 7:00 am

## 2017-01-20 ENCOUNTER — Ambulatory Visit (AMBULATORY_SURGERY_CENTER): Payer: Medicare Other

## 2017-01-20 VITALS — Ht 69.0 in | Wt 208.2 lb

## 2017-01-20 DIAGNOSIS — Z860100 Personal history of colon polyps, unspecified: Secondary | ICD-10-CM

## 2017-01-20 NOTE — Progress Notes (Signed)
No allergies to eggs or soy No diet meds No past problems with anesthesia Home oxygen!!!  Declined emmi

## 2017-02-03 ENCOUNTER — Encounter: Payer: Medicare Other | Admitting: Internal Medicine

## 2017-02-08 ENCOUNTER — Encounter: Payer: Medicare Other | Admitting: Internal Medicine

## 2017-02-10 ENCOUNTER — Encounter (HOSPITAL_COMMUNITY): Payer: Self-pay | Admitting: *Deleted

## 2017-02-20 ENCOUNTER — Ambulatory Visit (HOSPITAL_COMMUNITY): Payer: Medicare Other | Admitting: Certified Registered Nurse Anesthetist

## 2017-02-20 ENCOUNTER — Ambulatory Visit (HOSPITAL_COMMUNITY)
Admission: RE | Admit: 2017-02-20 | Discharge: 2017-02-20 | Disposition: A | Discharge status: Home / Self Care | Payer: Medicare Other | Source: Ambulatory Visit | Attending: Internal Medicine | Admitting: Internal Medicine

## 2017-02-20 ENCOUNTER — Encounter (HOSPITAL_COMMUNITY): Payer: Self-pay | Admitting: Certified Registered Nurse Anesthetist

## 2017-02-20 ENCOUNTER — Encounter (HOSPITAL_COMMUNITY)
Admission: RE | Disposition: A | Discharge status: Home / Self Care | Payer: Self-pay | Source: Ambulatory Visit | Attending: Internal Medicine

## 2017-02-20 DIAGNOSIS — G2581 Restless legs syndrome: Secondary | ICD-10-CM | POA: Diagnosis not present

## 2017-02-20 DIAGNOSIS — Z955 Presence of coronary angioplasty implant and graft: Secondary | ICD-10-CM | POA: Diagnosis not present

## 2017-02-20 DIAGNOSIS — Z9889 Other specified postprocedural states: Secondary | ICD-10-CM | POA: Diagnosis not present

## 2017-02-20 DIAGNOSIS — Z87891 Personal history of nicotine dependence: Secondary | ICD-10-CM | POA: Insufficient documentation

## 2017-02-20 DIAGNOSIS — I2510 Atherosclerotic heart disease of native coronary artery without angina pectoris: Secondary | ICD-10-CM | POA: Insufficient documentation

## 2017-02-20 DIAGNOSIS — I252 Old myocardial infarction: Secondary | ICD-10-CM | POA: Insufficient documentation

## 2017-02-20 DIAGNOSIS — K219 Gastro-esophageal reflux disease without esophagitis: Secondary | ICD-10-CM | POA: Diagnosis not present

## 2017-02-20 DIAGNOSIS — I10 Essential (primary) hypertension: Secondary | ICD-10-CM | POA: Diagnosis not present

## 2017-02-20 DIAGNOSIS — K5730 Diverticulosis of large intestine without perforation or abscess without bleeding: Secondary | ICD-10-CM | POA: Insufficient documentation

## 2017-02-20 DIAGNOSIS — E785 Hyperlipidemia, unspecified: Secondary | ICD-10-CM | POA: Insufficient documentation

## 2017-02-20 DIAGNOSIS — K648 Other hemorrhoids: Secondary | ICD-10-CM | POA: Insufficient documentation

## 2017-02-20 DIAGNOSIS — Z79899 Other long term (current) drug therapy: Secondary | ICD-10-CM | POA: Diagnosis not present

## 2017-02-20 DIAGNOSIS — Z7982 Long term (current) use of aspirin: Secondary | ICD-10-CM | POA: Diagnosis not present

## 2017-02-20 DIAGNOSIS — Z85828 Personal history of other malignant neoplasm of skin: Secondary | ICD-10-CM | POA: Insufficient documentation

## 2017-02-20 DIAGNOSIS — Z1211 Encounter for screening for malignant neoplasm of colon: Secondary | ICD-10-CM | POA: Diagnosis present

## 2017-02-20 DIAGNOSIS — Z860101 Personal history of adenomatous and serrated colon polyps: Secondary | ICD-10-CM

## 2017-02-20 DIAGNOSIS — Z86010 Personal history of colonic polyps: Secondary | ICD-10-CM | POA: Insufficient documentation

## 2017-02-20 HISTORY — DX: Anemia, unspecified: D64.9

## 2017-02-20 HISTORY — PX: COLONOSCOPY WITH PROPOFOL: SHX5780

## 2017-02-20 HISTORY — DX: Personal history of adenomatous and serrated colon polyps: Z86.0101

## 2017-02-20 HISTORY — DX: Personal history of colonic polyps: Z86.010

## 2017-02-20 HISTORY — DX: Dependence on supplemental oxygen: Z99.81

## 2017-02-20 SURGERY — COLONOSCOPY WITH PROPOFOL
Anesthesia: Monitor Anesthesia Care

## 2017-02-20 MED ORDER — PROPOFOL 10 MG/ML IV BOLUS
INTRAVENOUS | Status: AC
  Filled 2017-02-20: qty 40

## 2017-02-20 MED ORDER — SODIUM CHLORIDE 0.9 % IV SOLN: INTRAVENOUS | Status: DC

## 2017-02-20 MED ORDER — PROPOFOL 10 MG/ML IV BOLUS
INTRAVENOUS | Status: DC | PRN
  Administered 2017-02-20 (×11): 20 mg via INTRAVENOUS

## 2017-02-20 MED ORDER — LACTATED RINGERS IV SOLN
INTRAVENOUS | Status: DC
  Administered 2017-02-20: 1000 mL via INTRAVENOUS

## 2017-02-20 MED ORDER — LACTATED RINGERS IV SOLN
INTRAVENOUS | Status: DC | PRN
  Administered 2017-02-20: 08:00:00 via INTRAVENOUS

## 2017-02-20 SURGICAL SUPPLY — 21 items

## 2017-02-20 NOTE — Discharge Instructions (Signed)
° °  I did not see any polyps. I took biopsies of the sigmoid polyp removal scar - will let you know. This is routine - I do not anticipate problems.  You have diverticulosis and hemorrhoids as we have known.  I appreciate the opportunity to care for you. Gatha Mayer, MD, FACG  YOU HAD AN ENDOSCOPIC PROCEDURE TODAY: Refer to the procedure report and other information in the discharge instructions given to you for any specific questions about what was found during the examination. If this information does not answer your questions, please call Dr. Celesta Aver office at [phone] to clarify.   YOU SHOULD EXPECT: Some feelings of bloating in the abdomen. Passage of more gas than usual. Walking can help get rid of the air that was put into your GI tract during the procedure and reduce the bloating. If you had a lower endoscopy (such as a colonoscopy or flexible sigmoidoscopy) you may notice spotting of blood in your stool or on the toilet paper. Some abdominal soreness may be present for a day or two, also.  DIET: Your first meal following the procedure should be a light meal and then it is ok to progress to your normal diet. A half-sandwich or bowl of soup is an example of a good first meal. Heavy or fried foods are harder to digest and may make you feel nauseous or bloated. Drink plenty of fluids but you should avoid alcoholic beverages for 24 hours.   ACTIVITY: Your care partner should take you home directly after the procedure. You should plan to take it easy, moving slowly for the rest of the day. You can resume normal activity the day after the procedure however YOU SHOULD NOT DRIVE, use power tools, machinery or perform tasks that involve climbing or major physical exertion for 24 hours (because of the sedation medicines used during the test).   SYMPTOMS TO REPORT IMMEDIATELY: A gastroenterologist can be reached at any hour. Please call [phone]  for any of the following symptoms:    Following lower endoscopy (colonoscopy, flexible sigmoidoscopy) Excessive amounts of blood in the stool  Significant tenderness, worsening of abdominal pains  Swelling of the abdomen that is new, acute  Fever of 100 or higher    FOLLOW UP:  If any biopsies were taken you will be contacted by phone or by letter within the next 1-3 weeks. Call [phone]  if you have not heard about the biopsies in 3 weeks.  Please also call with any specific questions about appointments or follow up tests.

## 2017-02-20 NOTE — Anesthesia Preprocedure Evaluation (Addendum)
Anesthesia Evaluation  Patient identified by MRN, date of birth, ID band Patient awake    Reviewed: Allergy & Precautions, NPO status , Patient's Chart, lab work & pertinent test results  Airway Mallampati: II  TM Distance: >3 FB Neck ROM: Limited    Dental no notable dental hx.    Pulmonary shortness of breath, former smoker,    breath sounds clear to auscultation       Cardiovascular hypertension, + CAD and + Past MI   Rhythm:Regular Rate:Normal     Neuro/Psych    GI/Hepatic Neg liver ROS, GERD  ,  Endo/Other  negative endocrine ROS  Renal/GU negative Renal ROS     Musculoskeletal   Abdominal   Peds  Hematology   Anesthesia Other Findings   Reproductive/Obstetrics                            Anesthesia Physical Anesthesia Plan  ASA: III  Anesthesia Plan: MAC   Post-op Pain Management:    Induction: Intravenous  PONV Risk Score and Plan: 1 and Ondansetron and Dexamethasone  Airway Management Planned: Natural Airway and Simple Face Mask  Additional Equipment:   Intra-op Plan:   Post-operative Plan:   Informed Consent: I have reviewed the patients History and Physical, chart, labs and discussed the procedure including the risks, benefits and alternatives for the proposed anesthesia with the patient or authorized representative who has indicated his/her understanding and acceptance.   Dental advisory given  Plan Discussed with: CRNA  Anesthesia Plan Comments:         Anesthesia Quick Evaluation

## 2017-02-20 NOTE — Anesthesia Postprocedure Evaluation (Signed)
Anesthesia Post Note  Patient: Todd Stevens  Procedure(s) Performed: COLONOSCOPY WITH PROPOFOL (N/A )     Patient location during evaluation: Endoscopy Anesthesia Type: MAC Level of consciousness: awake and alert Pain management: pain level controlled Vital Signs Assessment: post-procedure vital signs reviewed and stable Respiratory status: spontaneous breathing, nonlabored ventilation, respiratory function stable and patient connected to nasal cannula oxygen Cardiovascular status: stable and blood pressure returned to baseline Postop Assessment: no apparent nausea or vomiting Anesthetic complications: no    Last Vitals:  Vitals:   02/20/17 0925 02/20/17 0930  BP:  (!) 143/71  Pulse: 63 63  Resp: (!) 22 (!) 22  Temp:    SpO2: 97% 97%    Last Pain:  Vitals:   02/20/17 0905  TempSrc: Oral                 Kaliope Quinonez,JAMES TERRILL

## 2017-02-20 NOTE — H&P (Signed)
[facility] History and Physical   Primary Care Physician:  Leanna Battles, MD   Reason for Procedure:   follow-up after removal of a sigmoid colon polyp with suspicion of superficial cancer  Plan:    Colonoscopy The risks and benefits as well as alternatives of endoscopic procedure(s) have been discussed and reviewed. All questions answered. The patient agrees to proceed.  HPI: Todd Stevens is a 76 y.o. male s/p colonoscopy last yr w/ adenomas and a sigmoid adenoma raised ? Of possible early invasion/carcinoma - f/u flex sig with ulcer - negative bxs. Here for close f/u of the polyp.   Past Medical History:  Diagnosis Date  . Anemia    none since 2017  . Aortic valve sclerosis   . CAD (coronary artery disease)   . Cervical disc disease    neuropathy  . Diverticulosis   . 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 Esophageal motility disorder   . GERD (gastroesophageal reflux disease)   . GERD with stricture    stenosis more than stricture  . History of home oxygen therapy    2 liters at hs  . HLD (hyperlipidemia)   . HTN (hypertension)   . Hypertension   . Internal hemorrhoids   . Myocardial infarct ([place]) 04/1999  . Obesity   . Pneumonia 01/2010   Hospitalized  . Restless leg syndrome   . Shortness of breath dyspnea    with exertion   . Skin cancer    areas removed  . Status post dilation of esophageal narrowing     Past Surgical History:  Procedure Laterality Date  . CARDIAC CATHETERIZATION    . COLONOSCOPY  08/2001   diverticulosis, internal hemorrhoids  . COLONOSCOPY WITH PROPOFOL N/A 12/07/2015   Procedure: COLONOSCOPY WITH PROPOFOL;  Surgeon: Gatha Mayer, MD;  Location: [facility]COPY;  Service: Endoscopy;  Laterality: N/A;  . CORONARY ANGIOPLASTY WITH STENT PLACEMENT  2000   stent x 1   . ESOPHAGOGASTRODUODENOSCOPY  01/2008   with esophageal dilation - esophageal dysmotility + stenosis, also benign fundic gland  gastric polyps  . FLEXIBLE SIGMOIDOSCOPY N/A 12/24/2015   Procedure: FLEXIBLE SIGMOIDOSCOPY;  Surgeon: Gatha Mayer, MD;  Location: [facility] ENDOSCOPY;  Service: Endoscopy;  Laterality: N/A;  . INGUINAL HERNIA REPAIR Right yrs ago  . SKIN CANCER EXCISION    . TONSILLECTOMY      Prior to Admission medications   Medication Sig Start Date End Date Taking? Authorizing Provider  aspirin 81 MG tablet Take 1 tablet (81 mg total) by mouth daily. STOP AND RESTART 12/21/15 12/07/15  Yes Gatha Mayer, MD  Bisacodyl (DULCOLAX PO) Take by mouth. x2 for colonoscopy   Yes [provider]  clorazepate (TRANXENE) 7.5 MG tablet Take 7.5 mg by mouth at bedtime as needed (restless legs).  09/30/14  Yes [provider]  esomeprazole (NEXIUM) 20 MG capsule Take 20 mg by mouth daily.    Yes [provider]  fluoruracil (CARAC) 0.5 % cream Apply 1 application topically as directed.   Yes [provider]  HYDROcodone-acetaminophen (NORCO/VICODIN) 5-325 MG tablet Take 0.5-1 tablets by mouth 2 (two) times daily as needed for moderate pain.  07/18/15  Yes [provider]  metoprolol succinate (TOPROL-XL) 50 MG 24 hr tablet Take 50 mg by mouth daily. 01/28/17  Yes [provider]  nitroGLYCERIN (NITROSTAT) 0.4 MG SL tablet Place 0.4 mg under the tongue every 5 (five) minutes as needed for  chest pain.    Yes [provider]  Polyethyl Glycol-Propyl Glycol (SYSTANE ULTRA OP) Apply 1 drop to eye daily as needed (dry eye).    Yes [provider]  polyethylene glycol powder (MIRALAX) powder Take 1 Container by mouth once.   Yes [provider]  PROBIOTIC CAPS Take 1 capsule by mouth daily. [employer]     Yes [provider]  quinapril-hydrochlorothiazide (ACCURETIC) 20-12.5 MG per tablet Take 1 tablet by mouth daily. 10/31/14  Yes [provider]  rosuvastatin (CRESTOR) 20 MG tablet Take 10 mg by mouth daily.   Yes [provider]   hydrocortisone cream 1 % Apply 1 application topically daily as needed (rash).    [provider]  neomycin-bacitracin-polymyxin (NEOSPORIN) ointment Apply 1 application topically daily as needed for wound care.    [provider]  VOLTAREN 1 % GEL Apply 1 application topically as needed (muscle pain).  09/16/14   [provider]    Current Facility-Administered Medications  Medication Dose Route Frequency Provider Last Rate Last Dose  . 0.9 %  sodium chloride infusion   Intravenous Continuous Gatha Mayer, MD      . lactated ringers infusion   Intravenous Continuous Gatha Mayer, MD 10 mL/hr at 02/20/17 0736 1,000 mL at 02/20/17 0736   Facility-Administered Medications Ordered in Other Encounters  Medication Dose Route Frequency Provider Last Rate Last Dose  . lactated ringers infusion    Continuous PRN Ofilia Neas, CRNA        Allergies as of 01/11/2017  . (No Known Allergies)    Family History  Problem Relation Age of Onset  . Heart attack Father        deceased at age 68  . Heart disease Father   . Alzheimer's disease Mother   . Alzheimer's disease Maternal Aunt        x 3  . Colon cancer Neg Hx   . Stomach cancer Neg Hx     Social History   Social History  . Marital status: Married    Spouse name: Gwen  . Number of children: 0  . Years of education: N/A   Occupational History  . retired Freight forwarder Retired   Social History Main Topics  . Smoking status: Former Smoker    Packs/day: 1.00    Years: 30.00    Types: Cigarettes    Quit date: 05/02/1988  . Smokeless tobacco: Never Used  . Alcohol use No  . Drug use: No    Review of Systems: All other review of systems negative except as mentioned in the HPI.  Physical Exam: Vital signs in last 24 hours: Temp:  [98.2 F (36.8 C)] 98.2 F (36.8 C) (10/22 0730) Pulse Rate:  [69] 69 (10/22 0730) Resp:  [24] 24 (10/22 0730) BP: (167)/(89) 167/89 (10/22 0730) SpO2:  [98 %] 98 %  (10/22 0730) Weight:  [200 lb (90.7 kg)] 200 lb (90.7 kg) (10/22 0730)   General:   Alert,  Well-developed, well-nourished, pleasant and cooperative in NAD Lungs:  Clear throughout to auscultation.   Heart:  Regular rate and rhythm; no murmurs, clicks, rubs,  or gallops. Abdomen:  Soft, nontender and nondistended. Normal bowel sounds.   Neuro/Psych:  Alert and cooperative. Normal mood and affect. A and O x 3   @Dallan Schonberg  Simonne Maffucci, MD, [facility] [phone] (pager) 02/20/2017 8:20 AM@

## 2017-02-20 NOTE — Op Note (Addendum)
[facility] Patient Name: Todd Stevens Procedure Date: 02/20/2017 MRN: [id number] Attending MD: Gatha Mayer , MD Date of Birth: 18-Nov-1940 CSN: [id number] Age: 76 Admit Type: Outpatient Procedure:                Colonoscopy Indications:              High risk colon cancer surveillance: Personal                            history of adenoma with high grade dysplasia ?                            superficial invasion Providers:                Gatha Mayer, MD, Carolynn Comment RN, RN,                            Alan Mulder, Technician Referring MD:              Medicines:                Propofol per Anesthesia, Monitored Anesthesia Care Complications:            No immediate complications. Estimated Blood Loss:     Estimated blood loss was minimal. Procedure:                Pre-Anesthesia Assessment:                           - Prior to the procedure, a History and Physical                            was performed, and patient medications and                            allergies were reviewed. The patient's tolerance of                            previous anesthesia was also reviewed. The risks                            and benefits of the procedure and the sedation                            options and risks were discussed with the patient.                            All questions were answered, and informed consent                            was obtained. Prior Anticoagulants: The patient has                            taken no previous anticoagulant or antiplatelet  agents. ASA Grade Assessment: III - A patient with                            severe systemic disease. After reviewing the risks                            and benefits, the patient was deemed in                            satisfactory condition to undergo the procedure.                           After obtaining informed consent, the colonoscope      was passed under direct vision. Throughout the                            procedure, the patient's blood pressure, pulse, and                            oxygen saturations were monitored continuously. The                            EC-[id number] ([id number]) scope was introduced through                            the anus and advanced to the the cecum, identified                            by appendiceal orifice and ileocecal valve. The                            colonoscopy was somewhat difficult due to                            significant looping. Successful completion of the                            procedure was aided by applying abdominal pressure.                            The patient tolerated the procedure well. The                            quality of the bowel preparation was good. The                            bowel preparation used was Miralax. The ileocecal                            valve, appendiceal orifice, and rectum were                            photographed. Scope In: 8:36:45 AM Scope Out: 8:54:55 AM Scope Withdrawal Time: 0  hours 12 minutes 53 seconds  Total Procedure Duration: 0 hours 18 minutes 10 seconds  Findings:      The perianal and digital rectal examinations were normal. Pertinent       negatives include normal prostate (size, shape, and consistency).      A medium post polypectomy scar was found in the sigmoid colon. There was       no evidence of the previous polyp. Biopsies were taken with a cold       forceps for histology. Verification of patient identification for the       specimen was done. Estimated blood loss was minimal.      Many diverticula were found in the entire colon.      Internal hemorrhoids were found during retroflexion.      The exam was otherwise without abnormality on direct and retroflexion       views. Impression:               - Post-polypectomy scar in the sigmoid colon.                            Biopsied.                            - Severe diverticulosis in the entire examined                            colon.                           - Internal hemorrhoids.                           - The examination was otherwise normal on direct                            and retroflexion views. Moderate Sedation:      N/A- Per Anesthesia Care Recommendation:           - Patient has a contact number available for                            emergencies. The signs and symptoms of potential                            delayed complications were discussed with the                            patient. Return to normal activities tomorrow.                            Written discharge instructions were provided to the                            patient.                           - Resume previous diet.                           -  Continue present medications.                           - Await pathology results.                           - Repeat colonoscopy may be recommended. The                            possible colonoscopy date will be determined after                            pathology results from today's exam become                            available for review. Anticipate office visit in 3                            years to review unless pathology tells otherwise                            (doubt) Procedure Code(s):        --- Professional ---                           [phone], Colonoscopy, flexible; with biopsy, single                            or multiple Diagnosis Code(s):        --- Professional ---                           Z86.010, Personal history of colonic polyps                           Z98.890, Other specified postprocedural states                           K64.8, Other hemorrhoids                           K57.30, Diverticulosis of large intestine without                            perforation or abscess without bleeding CPT copyright 2016 American Medical Association. All rights reserved. The  codes documented in this report are preliminary and upon coder review may  be revised to meet current compliance requirements. Gatha Mayer, MD 02/20/2017 9:14:29 AM This report has been signed electronically. Number of Addenda: 0

## 2017-02-20 NOTE — Transfer of Care (Signed)
Immediate Anesthesia Transfer of Care Note  Patient: Todd Stevens  Procedure(s) Performed: COLONOSCOPY WITH PROPOFOL (N/A )  Patient Location: PACU and Endoscopy Unit  Anesthesia Type:MAC  Level of Consciousness: awake, oriented, drowsy, patient cooperative and responds to stimulation  Airway & Oxygen Therapy: Patient Spontanous Breathing and Patient connected to face mask oxygen  Post-op Assessment: Report given to RN, Post -op Vital signs reviewed and stable and Patient moving all extremities  Post vital signs: Reviewed and stable  Last Vitals:  Vitals:   02/20/17 0730  BP: (!) 167/89  Pulse: 69  Resp: (!) 24  Temp: 36.8 C  SpO2: 98%    Last Pain:  Vitals:   02/20/17 0730  TempSrc: Oral         Complications: No apparent anesthesia complications

## 2017-02-22 ENCOUNTER — Encounter (HOSPITAL_COMMUNITY): Payer: Self-pay | Admitting: Internal Medicine

## 2017-02-26 ENCOUNTER — Encounter: Payer: Self-pay | Admitting: Internal Medicine

## 2017-02-26 DIAGNOSIS — Z86010 Personal history of colonic polyps: Secondary | ICD-10-CM

## 2017-02-26 NOTE — Progress Notes (Signed)
No residual polyp Recall colon 3 yrs 2021

## 2020-06-01 ENCOUNTER — Encounter: Payer: Self-pay | Admitting: Internal Medicine

## 2020-10-14 ENCOUNTER — Ambulatory Visit (INDEPENDENT_AMBULATORY_CARE_PROVIDER_SITE_OTHER): Payer: Medicare Other | Admitting: Internal Medicine

## 2020-10-14 ENCOUNTER — Encounter: Payer: Self-pay | Admitting: Internal Medicine

## 2020-10-14 VITALS — BP 110/66 | HR 56 | Ht 69.0 in | Wt 209.0 lb

## 2020-10-14 DIAGNOSIS — E669 Obesity, unspecified: Secondary | ICD-10-CM

## 2020-10-14 DIAGNOSIS — R140 Abdominal distension (gaseous): Secondary | ICD-10-CM | POA: Diagnosis not present

## 2020-10-14 DIAGNOSIS — E8881 Metabolic syndrome: Secondary | ICD-10-CM | POA: Diagnosis not present

## 2020-10-14 DIAGNOSIS — Z86010 Personal history of colonic polyps: Secondary | ICD-10-CM

## 2020-10-14 DIAGNOSIS — K589 Irritable bowel syndrome without diarrhea: Secondary | ICD-10-CM | POA: Diagnosis not present

## 2020-10-14 NOTE — Progress Notes (Signed)
Todd Stevens 80 y.o. Apr 04, 1941 [id number]  Assessment & Plan:   Encounter Diagnoses  Name Primary?   Hx of adenomatous colonic polyps Yes   Abdominal bloating    Irritable bowel syndrome, unspecified type    Abdominal obesity and metabolic syndrome      I have recommended he accepts not to have further surveillance colonoscopy.  I do not think this would increase his life expectancy.  I have advised that he not do annual Hemoccult testing if offered  Senokot as needed constipation when using hydrocodone  Nutrition advice given to the patient today in the form of handouts regarding healthy eating in general, carbohydrates, fats and protein as well as other nutrients through the [facility] program.  I think if he could reduce some of his carbohydrates his IBS might improve.  He does have abdominal obesity and metabolic syndrome issues and it may help with that as well.  Follow-up here as needed.  I appreciate the opportunity to care for this patient. CC: Leanna Battles, MD    Subjective:   Chief Complaint: History of colon polyps question colonoscopy  HPI 80 year old white man with a history of colon polyps as below including possible invasive carcinoma and plans after 2 colonoscopies as reflected below as well  12/2015 15 mm sigmoid TV adenoma HGD w/ ? Invasive carcinoma and a 6 mm polyp - f/u flex sig no polyp left - tattoo 01/2017 no residual polyp recall 2021 consider repeat colon but will be 79   Today he complains of excessive gas and some bloating but really this is stable and chronic.  About every 3 months he may have a streak of blood on the tissue paper chronically for years.  When he uses hydrocodone for his pain which he does several times a month at least he may get constipated.  Brief diet history breakfast is cereal or granola including things like fruit loops.  Occasional biscuit with meat.  Lunch is a half a sandwich ham or pastrami and dinner  is things like spaghetti or grilled chicken and some vegetables. Not on File Current Meds  Medication Sig   aspirin 81 MG tablet Take 1 tablet (81 mg total) by mouth daily. STOP AND RESTART 12/21/15   clorazepate (TRANXENE) 7.5 MG tablet Take 7.5 mg by mouth at bedtime as needed (restless legs).    esomeprazole (NEXIUM) 20 MG capsule Take 20 mg by mouth daily.    fluoruracil (CARAC) 0.5 % cream Apply 1 application topically as directed.   HYDROcodone-acetaminophen (NORCO/VICODIN) 5-325 MG tablet Take 0.5-1 tablets by mouth 2 (two) times daily as needed for moderate pain.    hydrocortisone cream 1 % Apply 1 application topically daily as needed (rash).   metoprolol succinate (TOPROL-XL) 50 MG 24 hr tablet Take 50 mg by mouth daily.   neomycin-bacitracin-polymyxin (NEOSPORIN) ointment Apply 1 application topically daily as needed for wound care.   nitroGLYCERIN (NITROSTAT) 0.4 MG SL tablet Place 0.4 mg under the tongue every 5 (five) minutes as needed for chest pain.    Polyethyl Glycol-Propyl Glycol (SYSTANE ULTRA OP) Apply 1 drop to eye daily as needed (dry eye).    PROBIOTIC CAPS Take 1 capsule by mouth daily. [employer]     quinapril-hydrochlorothiazide (ACCURETIC) 20-12.5 MG per tablet Take 1 tablet by mouth daily.   rosuvastatin (CRESTOR) 20 MG tablet Take 10 mg by mouth daily.   VOLTAREN 1 % GEL Apply 1 application topically as needed (muscle pain).    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   Obesity    Pneumonia 01/2010   Hospitalized   Restless leg syndrome    Shortness of breath dyspnea    with exertion    Skin cancer    areas removed   Status post dilation of esophageal narrowing    Past Surgical History:  Procedure Laterality Date   CARDIAC CATHETERIZATION     COLONOSCOPY  08/2001   diverticulosis, internal hemorrhoids   COLONOSCOPY WITH PROPOFOL N/A 12/07/2015   Procedure: COLONOSCOPY WITH PROPOFOL;  Surgeon: Gatha Mayer, MD;  Location: [facility] ENDOSCOPY;  Service: Endoscopy;  Laterality: N/A;   COLONOSCOPY WITH PROPOFOL N/A 02/20/2017   Procedure: COLONOSCOPY WITH PROPOFOL;  Surgeon: Gatha Mayer, MD;  Location: [facility] ENDOSCOPY;  Service: Endoscopy;  Laterality: N/A;   CORONARY ANGIOPLASTY WITH STENT PLACEMENT  2000   stent x 1    ESOPHAGOGASTRODUODENOSCOPY  01/2008   with esophageal dilation - esophageal dysmotility + stenosis, also benign fundic gland gastric polyps   FLEXIBLE SIGMOIDOSCOPY N/A 12/24/2015   Procedure: FLEXIBLE SIGMOIDOSCOPY;  Surgeon: Gatha Mayer, MD;  Location: [facility] ENDOSCOPY;  Service: Endoscopy;  Laterality: N/A;   INGUINAL HERNIA REPAIR Right yrs ago   SKIN CANCER EXCISION     TONSILLECTOMY     Social History   Social History Narrative   Married and retired   No children   Former smoker, no alcohol 1 caffeinated beverage daily   family history includes Alzheimer's disease in his maternal aunt and mother; Heart attack in his father; Heart disease in his father.   Review of Systems  As per HPI notable also for a lot of neck and joint pains and back pain for which he uses hydrocodone as needed. Objective:   Physical Exam BP 110/66   Pulse (!) 56   Ht 5\' 9"  (1.753 m)   Wt 209 lb (94.8 kg)   BMI 30.86 kg/m  He is alert and oriented his hearing is intact He has a protuberant obese abdomen

## 2020-10-14 NOTE — Patient Instructions (Signed)
Please don't do hemoccult testing at your PCP office anymore.   We are providing you with healthy eating information to read and follow.   Stop eating cereal for breakfast.   Follow up as needed with [place].  If you are age 80 or older, your body mass index should be between 23-30. Your Body mass index is 30.86 kg/m. If this is out of the aforementioned range listed, please consider follow up with your Primary Care Provider.   The [facility] providers would like to encourage you to use [facility] to communicate with providers for non-urgent requests or questions.  Due to long hold times on the telephone, sending your provider a message by [facility] may be a faster and more efficient way to get a response.  Please allow 48 business hours for a response.  Please remember that this is for non-urgent requests.     I appreciate the opportunity to care for you. Silvano Rusk, MD, [facility]

## 2022-03-26 ENCOUNTER — Encounter (HOSPITAL_COMMUNITY): Payer: Self-pay

## 2022-03-26 ENCOUNTER — Other Ambulatory Visit: Payer: Self-pay

## 2022-03-26 ENCOUNTER — Emergency Department (HOSPITAL_COMMUNITY): Payer: Medicare Other

## 2022-03-26 ENCOUNTER — Emergency Department (HOSPITAL_COMMUNITY)
Admission: EM | Admit: 2022-03-26 | Discharge: 2022-03-27 | Disposition: A | Discharge status: Home / Self Care | Payer: Medicare Other | Attending: Emergency Medicine | Admitting: Emergency Medicine

## 2022-03-26 DIAGNOSIS — Z79899 Other long term (current) drug therapy: Secondary | ICD-10-CM | POA: Diagnosis not present

## 2022-03-26 DIAGNOSIS — Z7982 Long term (current) use of aspirin: Secondary | ICD-10-CM | POA: Insufficient documentation

## 2022-03-26 DIAGNOSIS — I10 Essential (primary) hypertension: Secondary | ICD-10-CM

## 2022-03-26 NOTE — ED Triage Notes (Signed)
Pt states that his blood pressure was elevated today at home (196/96). Pt states that he takes blood pressure medication but it never gets that high. Pt is on 2L O2 per baseline.

## 2022-03-27 ENCOUNTER — Emergency Department (HOSPITAL_COMMUNITY): Payer: Medicare Other

## 2022-03-27 ENCOUNTER — Encounter (HOSPITAL_COMMUNITY): Payer: Self-pay | Admitting: Radiology

## 2022-03-27 LAB — TROPONIN I (HIGH SENSITIVITY)
Troponin I (High Sensitivity): 35 ng/L — ABNORMAL HIGH (ref <18)
Troponin I (High Sensitivity): 43 ng/L — ABNORMAL HIGH (ref <18)

## 2022-03-27 LAB — I-STAT CHEM 8, ED
BUN: 15 mg/dL (ref 8–23)
Calcium, Ion: 1.13 mmol/L — ABNORMAL LOW (ref 1.15–1.40)
Chloride: 92 mmol/L — ABNORMAL LOW (ref 98–111)
Creatinine, Ser: 0.8 mg/dL (ref 0.61–1.24)
Glucose, Bld: 110 mg/dL — ABNORMAL HIGH (ref 70–99)
HCT: 46 % (ref 39.0–52.0)
Hemoglobin: 15.6 g/dL (ref 13.0–17.0)
Potassium: 3.7 mmol/L (ref 3.5–5.1)
Sodium: 133 mmol/L — ABNORMAL LOW (ref 135–145)
TCO2: 30 mmol/L (ref 22–32)

## 2022-03-27 LAB — CBC WITH DIFFERENTIAL/PLATELET
Abs Immature Granulocytes: 0.02 10*3/uL (ref 0.00–0.07)
Basophils Absolute: 0.1 10*3/uL (ref 0.0–0.1)
Basophils Relative: 1 %
Eosinophils Absolute: 0.1 10*3/uL (ref 0.0–0.5)
Eosinophils Relative: 1 %
HCT: 44.1 % (ref 39.0–52.0)
Hemoglobin: 15 g/dL (ref 13.0–17.0)
Immature Granulocytes: 0 %
Lymphocytes Relative: 20 %
Lymphs Abs: 1.7 10*3/uL (ref 0.7–4.0)
MCH: 31.4 pg (ref 26.0–34.0)
MCHC: 34 g/dL (ref 30.0–36.0)
MCV: 92.5 fL (ref 80.0–100.0)
Monocytes Absolute: 0.9 10*3/uL (ref 0.1–1.0)
Monocytes Relative: 10 %
Neutro Abs: 5.7 10*3/uL (ref 1.7–7.7)
Neutrophils Relative %: 68 %
Platelets: 248 10*3/uL (ref 150–400)
RBC: 4.77 MIL/uL (ref 4.22–5.81)
RDW: 11.9 % (ref 11.5–15.5)
WBC: 8.4 10*3/uL (ref 4.0–10.5)
nRBC: 0 % (ref 0.0–0.2)

## 2022-03-27 MED ORDER — IOHEXOL 350 MG/ML SOLN
75.0000 mL | Freq: Once | INTRAVENOUS | Status: AC | PRN
  Administered 2022-03-27: 75 mL via INTRAVENOUS

## 2022-03-27 MED ORDER — SODIUM CHLORIDE (PF) 0.9 % IJ SOLN
INTRAMUSCULAR | Status: AC
  Filled 2022-03-27: qty 50

## 2022-03-27 NOTE — ED Provider Notes (Signed)
[facility] DEPT Provider Note   CSN: [id number] Arrival date & time: 03/26/22  2240     History  Chief Complaint  Patient presents with   Hypertension    Todd Stevens is a 81 y.o. male.  The history is provided by the patient, a relative and the spouse.  Hypertension This is a recurrent problem. The current episode started 3 to 5 hours ago. The problem occurs constantly. The problem has been gradually improving. Associated symptoms include shortness of breath. Pertinent negatives include no chest pain and no abdominal pain. Associated symptoms comments: 2 weeks of SOB. Nothing aggravates the symptoms. Nothing relieves the symptoms. He has tried nothing for the symptoms. The treatment provided moderate relief.  Patient with HTN who's BP was worse tonight and has had approximately 2 weeks of SOB since cross country travel, not exertional symptoms, no chest pain.  Is on 2L [place] at home.  No fevers.       Home Medications Prior to Admission medications   Medication Sig Start Date End Date Taking? Authorizing Provider  aspirin 81 MG tablet Take 1 tablet (81 mg total) by mouth daily. STOP AND RESTART 12/21/15 12/07/15   Gatha Mayer, MD  clorazepate (TRANXENE) 7.5 MG tablet Take 7.5 mg by mouth at bedtime as needed (restless legs).  09/30/14   [provider]  esomeprazole (NEXIUM) 20 MG capsule Take 20 mg by mouth daily.     [provider]  fluoruracil (CARAC) 0.5 % cream Apply 1 application topically as directed.    [provider]  HYDROcodone-acetaminophen (NORCO/VICODIN) 5-325 MG tablet Take 0.5-1 tablets by mouth 2 (two) times daily as needed for moderate pain.  07/18/15   [provider]  hydrocortisone cream 1 % Apply 1 application topically daily as needed (rash).    [provider]  metoprolol succinate (TOPROL-XL) 50 MG 24 hr tablet Take 50 mg by mouth daily. 01/28/17   [provider]   neomycin-bacitracin-polymyxin (NEOSPORIN) ointment Apply 1 application topically daily as needed for wound care.    [provider]  nitroGLYCERIN (NITROSTAT) 0.4 MG SL tablet Place 0.4 mg under the tongue every 5 (five) minutes as needed for chest pain.     [provider]  Polyethyl Glycol-Propyl Glycol (SYSTANE ULTRA OP) Apply 1 drop to eye daily as needed (dry eye).     [provider]  PROBIOTIC CAPS Take 1 capsule by mouth daily. [employer]      [provider]  quinapril-hydrochlorothiazide (ACCURETIC) 20-12.5 MG per tablet Take 1 tablet by mouth daily. 10/31/14   [provider]  rosuvastatin (CRESTOR) 20 MG tablet Take 10 mg by mouth daily.    [provider]  VOLTAREN 1 % GEL Apply 1 application topically as needed (muscle pain).  09/16/14   [provider]      Allergies    Patient has no allergy information on record.    Review of Systems   Review of Systems  Constitutional:  Negative for fever.  HENT:  Negative for facial swelling.   Eyes:  Negative for redness.  Respiratory:  Positive for shortness of breath. Negative for wheezing and stridor.   Cardiovascular:  Negative for chest pain and leg swelling.  Gastrointestinal:  Negative for abdominal pain.  All other systems reviewed and are negative.   Physical Exam Updated Vital Signs BP (!) 110/98   Pulse (!) 108   Temp 98.5 F (36.9 C) (Oral)   Resp  17   SpO2 92%  Physical Exam Vitals and nursing note reviewed.  Constitutional:      General: He is not in acute distress.    Appearance: Normal appearance. He is well-developed. He is not diaphoretic.  HENT:     Head: Normocephalic and atraumatic.     Nose: Nose normal.  Eyes:     Conjunctiva/sclera: Conjunctivae normal.     Pupils: Pupils are equal, round, and reactive to light.  Cardiovascular:     Rate and Rhythm: Normal rate and regular rhythm.     Pulses: Normal pulses.     Heart sounds: Normal heart  sounds.  Pulmonary:     Effort: Pulmonary effort is normal.     Breath sounds: Normal breath sounds. No wheezing or rales.  Abdominal:     General: Bowel sounds are normal.     Palpations: Abdomen is soft.     Tenderness: There is no abdominal tenderness. There is no guarding or rebound.  Musculoskeletal:        General: Normal range of motion.     Cervical back: Normal range of motion and neck supple.     Right lower leg: No edema.     Left lower leg: No edema.  Skin:    General: Skin is warm and dry.     Capillary Refill: Capillary refill takes less than 2 seconds.  Neurological:     General: No focal deficit present.     Mental Status: He is alert and oriented to person, place, and time.     Deep Tendon Reflexes: Reflexes normal.  Psychiatric:        Mood and Affect: Mood normal.     ED Results / Procedures / Treatments   Labs (all labs ordered are listed, but only abnormal results are displayed) Results for orders placed or performed during the hospital encounter of 03/26/22  CBC with Differential  Result Value Ref Range   WBC 8.4 4.0 - 10.5 K/uL   RBC 4.77 4.22 - 5.81 MIL/uL   Hemoglobin 15.0 13.0 - 17.0 g/dL   HCT 44.1 39.0 - 52.0 %   MCV 92.5 80.0 - 100.0 fL   MCH 31.4 26.0 - 34.0 pg   MCHC 34.0 30.0 - 36.0 g/dL   RDW 11.9 11.5 - 15.5 %   Platelets 248 150 - 400 K/uL   nRBC 0.0 0.0 - 0.2 %   Neutrophils Relative % 68 %   Neutro Abs 5.7 1.7 - 7.7 K/uL   Lymphocytes Relative 20 %   Lymphs Abs 1.7 0.7 - 4.0 K/uL   Monocytes Relative 10 %   Monocytes Absolute 0.9 0.1 - 1.0 K/uL   Eosinophils Relative 1 %   Eosinophils Absolute 0.1 0.0 - 0.5 K/uL   Basophils Relative 1 %   Basophils Absolute 0.1 0.0 - 0.1 K/uL   Immature Granulocytes 0 %   Abs Immature Granulocytes 0.02 0.00 - 0.07 K/uL  I-stat chem 8, ED (not at [facility] or [facility])  Result Value Ref Range   Sodium 133 (L) 135 - 145 mmol/L   Potassium 3.7 3.5 - 5.1 mmol/L   Chloride 92 (L) 98 - 111 mmol/L   BUN 15  8 - 23 mg/dL   Creatinine, Ser 0.80 0.61 - 1.24 mg/dL   Glucose, Bld 110 (H) 70 - 99 mg/dL   Calcium, Ion 1.13 (L) 1.15 - 1.40 mmol/L   TCO2 30 22 - 32 mmol/L   Hemoglobin 15.6 13.0 - 17.0 g/dL  HCT 46.0 39.0 - 52.0 %  Troponin I (High Sensitivity)  Result Value Ref Range   Troponin I (High Sensitivity) 35 (H) <18 ng/L  Troponin I (High Sensitivity)  Result Value Ref Range   Troponin I (High Sensitivity) 43 (H) <18 ng/L   CT Angio Chest PE W and/or Wo Contrast  Result Date: 03/27/2022 CLINICAL DATA:  Shortness of breath and presyncopal episode, initial encounter EXAM: CT ANGIOGRAPHY CHEST WITH CONTRAST TECHNIQUE: Multidetector CT imaging of the chest was performed using the standard protocol during bolus administration of intravenous contrast. Multiplanar CT image reconstructions and MIPs were obtained to evaluate the vascular anatomy. RADIATION DOSE REDUCTION: This exam was performed according to the departmental dose-optimization program which includes automated exposure control, adjustment of the mA and/or kV according to patient size and/or use of iterative reconstruction technique. CONTRAST:  73m OMNIPAQUE IOHEXOL 350 MG/ML SOLN COMPARISON:  Chest x-ray from the previous day. FINDINGS: Cardiovascular: Thoracic aorta demonstrates atherosclerotic calcifications without aneurysmal dilatation or dissection. Coronary calcifications are seen. No cardiac enlargement is noted. Pulmonary artery shows a normal branching pattern without evidence of intraluminal filling defect to suggest pulmonary embolism. Mediastinum/Nodes: Thoracic inlet is within normal limits. No hilar or mediastinal adenopathy is noted. Esophagus is within normal limits. Lungs/Pleura: Mild atelectatic changes are noted in the bases bilaterally. No sizable effusion is seen. No parenchymal nodules are noted. Upper Abdomen: Visualized upper abdomen shows no acute abnormality. Musculoskeletal: No acute rib abnormality is seen.  Degenerative changes of the thoracic spine are noted. Review of the MIP images confirms the above findings. IMPRESSION: No evidence of pulmonary emboli. Mild bibasilar atelectasis. Electronically Signed   By: MInez CatalinaM.D.   On: 03/27/2022 03:11   DG Chest Portable 1 View  Result Date: 03/26/2022 CLINICAL DATA:  Hypertension EXAM: PORTABLE CHEST 1 VIEW COMPARISON:  Radiographs 10/15/2010 FINDINGS: Low lung volumes. Similar cardiomediastinal silhouette. Aortic atherosclerotic calcification. Coronary stenting. Left basilar atelectasis/consolidation. Trace left pleural effusion or pleural thickening. Findings are similar to 10/15/2010. No acute osseous abnormality. IMPRESSION: Left-greater-than-right presumed atelectasis. Pneumonia is difficult to exclude. Electronically Signed   By: TPlacido SouM.D.   On: 03/26/2022 23:44    EKG  EKG Interpretation  Date/Time:  Sunday March 27 2022 04:16:25 EST Ventricular Rate:  79 PR Interval:  143 QRS Duration: 107 QT Interval:  380 QTC Calculation: 436 R Axis:   -11 Text Interpretation: Sinus rhythm Multiform ventricular premature complexes Confirmed by PRandal Buba Alexey Rhoads ([id number]) on 03/27/2022 5:53:40 AM        Radiology CT Angio Chest PE W and/or Wo Contrast  Result Date: 03/27/2022 CLINICAL DATA:  Shortness of breath and presyncopal episode, initial encounter EXAM: CT ANGIOGRAPHY CHEST WITH CONTRAST TECHNIQUE: Multidetector CT imaging of the chest was performed using the standard protocol during bolus administration of intravenous contrast. Multiplanar CT image reconstructions and MIPs were obtained to evaluate the vascular anatomy. RADIATION DOSE REDUCTION: This exam was performed according to the departmental dose-optimization program which includes automated exposure control, adjustment of the mA and/or kV according to patient size and/or use of iterative reconstruction technique. CONTRAST:  752mOMNIPAQUE IOHEXOL 350 MG/ML SOLN COMPARISON:   Chest x-ray from the previous day. FINDINGS: Cardiovascular: Thoracic aorta demonstrates atherosclerotic calcifications without aneurysmal dilatation or dissection. Coronary calcifications are seen. No cardiac enlargement is noted. Pulmonary artery shows a normal branching pattern without evidence of intraluminal filling defect to suggest pulmonary embolism. Mediastinum/Nodes: Thoracic inlet is within normal limits. No hilar or mediastinal adenopathy is noted. Esophagus  is within normal limits. Lungs/Pleura: Mild atelectatic changes are noted in the bases bilaterally. No sizable effusion is seen. No parenchymal nodules are noted. Upper Abdomen: Visualized upper abdomen shows no acute abnormality. Musculoskeletal: No acute rib abnormality is seen. Degenerative changes of the thoracic spine are noted. Review of the MIP images confirms the above findings. IMPRESSION: No evidence of pulmonary emboli. Mild bibasilar atelectasis. Electronically Signed   By: Inez Catalina M.D.   On: 03/27/2022 03:11   DG Chest Portable 1 View  Result Date: 03/26/2022 CLINICAL DATA:  Hypertension EXAM: PORTABLE CHEST 1 VIEW COMPARISON:  Radiographs 10/15/2010 FINDINGS: Low lung volumes. Similar cardiomediastinal silhouette. Aortic atherosclerotic calcification. Coronary stenting. Left basilar atelectasis/consolidation. Trace left pleural effusion or pleural thickening. Findings are similar to 10/15/2010. No acute osseous abnormality. IMPRESSION: Left-greater-than-right presumed atelectasis. Pneumonia is difficult to exclude. Electronically Signed   By: Placido Sou M.D.   On: 03/26/2022 23:44    Procedures Procedures    Medications Ordered in ED Medications  iohexol (OMNIPAQUE) 350 MG/ML injection 75 mL (75 mLs Intravenous Contrast Given 03/27/22 0241)  sodium chloride (PF) 0.9 % injection (  Given 03/27/22 0227)    ED Course/ Medical Decision Making/ A&P                           Medical Decision Making Patient with  Elevated BP at home tonight and 2 weeks of SOB since travel  Amount and/or Complexity of Data Reviewed Independent Historian: spouse    Details: See above  External Data Reviewed: notes.    Details: Previous notes reviewed  Labs: ordered.    Details: All labs reviewed: normal white count 8.4, normal hemoglobin 15, normal platelet count.  Troponin 35/43.  Sodium slight low 133.  Normal potassium and creatinine  Radiology: ordered and independent interpretation performed.    Details: No PE by me on CTA, no PNA on cxr  ECG/medicine tests: ordered and independent interpretation performed. Decision-making details documented in ED Course. Discussion of management or test interpretation with external provider(s): Case d/w Dr. Shellia Carwin of cardiology.  This is not an elevated troponin in the setting of no chest pain and the EKG and is safe for discharge with close office follow up.    Risk Prescription drug management. Risk Details: Patient with ongoing SOB, no chest pain. I do not believe this is ACS.  Patient's BP improved without intervention. Troponins mildly elevated which are flat without chest pain or exertional symptoms.  No PE.  I have discussed the case with cardiology who agrees patient may follow up in the office.      Final Clinical Impression(s) / ED Diagnoses Final diagnoses:  Primary hypertension   Return for intractable cough, coughing up blood, fevers > 100.4 unrelieved by medication, shortness of breath, intractable vomiting, chest pain, shortness of breath, weakness, numbness, changes in speech, facial asymmetry, abdominal pain, passing out, Inability to tolerate liquids or food, cough, altered mental status or any concerns. No signs of systemic illness or infection. The patient is nontoxic-appearing on exam and vital signs are within normal limits.  I have reviewed the triage vital signs and the nursing notes. Pertinent labs & imaging results that were available during my care of  the patient were reviewed by me and considered in my medical decision making (see chart for details). After history, exam, and medical workup I feel the patient has been appropriately medically screened and is safe for discharge home. Pertinent diagnoses  were discussed with the patient. Patient was given return precautions.  Rx / DC Orders ED Discharge Orders     None         Devani Odonnel, MD 03/27/22 [phone]

## 2023-08-16 ENCOUNTER — Ambulatory Visit: Admitting: Orthopaedic Surgery

## 2023-08-16 ENCOUNTER — Other Ambulatory Visit (INDEPENDENT_AMBULATORY_CARE_PROVIDER_SITE_OTHER)

## 2023-08-16 ENCOUNTER — Encounter: Payer: Self-pay | Admitting: Orthopaedic Surgery

## 2023-08-16 DIAGNOSIS — M5441 Lumbago with sciatica, right side: Secondary | ICD-10-CM

## 2023-08-16 DIAGNOSIS — G8929 Other chronic pain: Secondary | ICD-10-CM

## 2023-08-16 DIAGNOSIS — M5442 Lumbago with sciatica, left side: Secondary | ICD-10-CM

## 2023-08-16 DIAGNOSIS — M79605 Pain in left leg: Secondary | ICD-10-CM | POA: Diagnosis not present

## 2023-08-16 NOTE — Progress Notes (Signed)
 The patient is an 83 year old gentleman who comes in with his wife to have performed knee replacement surgery on in the past.  He reports bilateral hip pain but he really points to his lower back and his backside a source of his pain.  It seems like it is more sciatica.  The left is definitely worse for him in the right side.  He says it is a burning pain sometimes and at times he feels like his legs will even hold him up anymore.  He denies any groin pain.  He does report some back pain as well.  He is not on blood thinning medication.  I was able to review all of his past medical history and medications within epic.  He does not walk with assistive device.  He is slow to stand up from a seated position.  Both hips move smoothly and fluidly with no blocks to rotation and no pain around the groin and no pain around either hip trochanteric area either.  All of his pain seems to be in the ischial and sciatic area on both sides.  He has weakness in his legs but no numbness and tingling.  X-ray of his pelvis and both hips are normal with normal-appearing joint space.  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  At This point we do need to obtain an MRI given his radicular symptoms and significant sciatica combined with weakness in his legs.  We will see him back once we have this MRI and then can go from there in terms of intervention such as injections or even PT.  They agree with this treatment plan and I will be in touch once he knows the date of the MRI of his lumbar spine.

## 2023-08-17 ENCOUNTER — Other Ambulatory Visit: Payer: Self-pay | Admitting: Radiology

## 2023-08-17 DIAGNOSIS — G8929 Other chronic pain: Secondary | ICD-10-CM

## 2023-08-24 ENCOUNTER — Ambulatory Visit
Admission: RE | Admit: 2023-08-24 | Discharge: 2023-08-24 | Discharge status: Home / Self Care | Source: Ambulatory Visit | Attending: Orthopaedic Surgery | Admitting: Orthopaedic Surgery

## 2023-08-24 DIAGNOSIS — G8929 Other chronic pain: Secondary | ICD-10-CM

## 2023-09-08 ENCOUNTER — Ambulatory Visit: Admitting: Orthopaedic Surgery

## 2023-09-14 ENCOUNTER — Ambulatory Visit: Admitting: Orthopaedic Surgery

## 2023-09-14 ENCOUNTER — Encounter: Payer: Self-pay | Admitting: Orthopaedic Surgery

## 2023-09-14 ENCOUNTER — Other Ambulatory Visit: Payer: Self-pay

## 2023-09-14 DIAGNOSIS — M5441 Lumbago with sciatica, right side: Secondary | ICD-10-CM | POA: Diagnosis not present

## 2023-09-14 DIAGNOSIS — M5442 Lumbago with sciatica, left side: Secondary | ICD-10-CM | POA: Diagnosis not present

## 2023-09-14 DIAGNOSIS — M79605 Pain in left leg: Secondary | ICD-10-CM

## 2023-09-14 DIAGNOSIS — G8929 Other chronic pain: Secondary | ICD-10-CM

## 2023-09-14 NOTE — Progress Notes (Signed)
 The patient is an 83 year old gentleman who comes in today to go over MRI of his lumbar spine.  He is having significant low back pain that radiates into the pelvis on both sides and some into the sciatic area.  He also feels like his legs are weak.  He does walk with walking sticks.  He has an issue with a diaphragm so he sleeps in a reclining chair.  On exam both hips move smoothly and fluidly.  We x-rayed his hips at his last visit and they were normal.  Both knees have normal exam.  He just is rigid and less flexible in general and weak in his lower extremities.  He points to the lower aspect of his lumbar spine and pelvis the source of his pain.  The MRI of his lumbar spine shows just minimal degenerative changes and no compressive neuropathy.  He has some mild arthritic changes at the facet joints at L4-L5.  This point he would definitely benefit from outpatient physical therapy for any modalities that can help strengthen his core and his lower extremities as well as decrease his low back pain.  We can get this set up through the [facility] at [place] is where they prefer to go in seeing with a therapist Loetta Ringer who has seen his wife before.  We can then see him back in about 8 weeks to see how he is doing overall.

## 2023-09-27 ENCOUNTER — Other Ambulatory Visit: Payer: Self-pay

## 2023-09-27 ENCOUNTER — Ambulatory Visit: Attending: Orthopaedic Surgery

## 2023-09-27 DIAGNOSIS — M79605 Pain in left leg: Secondary | ICD-10-CM | POA: Insufficient documentation

## 2023-09-27 DIAGNOSIS — R252 Cramp and spasm: Secondary | ICD-10-CM | POA: Diagnosis present

## 2023-09-27 DIAGNOSIS — M5441 Lumbago with sciatica, right side: Secondary | ICD-10-CM | POA: Diagnosis not present

## 2023-09-27 DIAGNOSIS — M79604 Pain in right leg: Secondary | ICD-10-CM | POA: Diagnosis present

## 2023-09-27 DIAGNOSIS — M5442 Lumbago with sciatica, left side: Secondary | ICD-10-CM | POA: Insufficient documentation

## 2023-09-27 DIAGNOSIS — R262 Difficulty in walking, not elsewhere classified: Secondary | ICD-10-CM | POA: Insufficient documentation

## 2023-09-27 DIAGNOSIS — G8929 Other chronic pain: Secondary | ICD-10-CM | POA: Diagnosis not present

## 2023-09-27 DIAGNOSIS — M5416 Radiculopathy, lumbar region: Secondary | ICD-10-CM | POA: Insufficient documentation

## 2023-09-27 DIAGNOSIS — M5459 Other low back pain: Secondary | ICD-10-CM | POA: Insufficient documentation

## 2023-09-27 DIAGNOSIS — M6281 Muscle weakness (generalized): Secondary | ICD-10-CM | POA: Diagnosis present

## 2023-09-27 DIAGNOSIS — R293 Abnormal posture: Secondary | ICD-10-CM | POA: Insufficient documentation

## 2023-09-27 NOTE — Therapy (Signed)
 OUTPATIENT PHYSICAL THERAPY THORACOLUMBAR EVALUATION   Patient Name: Todd Stevens MRN: [id number] DOB:11-Sep-1940, 83 y.o., male Today's Date: 09/27/2023  END OF SESSION:  PT End of Session - 09/27/23 1241     Visit Number 1    Date for PT Re-Evaluation 11/22/23    Authorization Type U[employer]    PT Start Time 1235    PT Stop Time 1315    PT Time Calculation (min) 40 min    Activity Tolerance Patient tolerated treatment well    Behavior During Therapy WFL for tasks assessed/performed             Past Medical History:  Diagnosis Date   Anemia    none since 2017   Aortic valve sclerosis    CAD (coronary artery disease)    Cervical disc disease    neuropathy   Diverticulosis    Elevated hemidiaphragm    right, 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   Obesity    Pneumonia 01/2010   Hospitalized   Restless leg syndrome    Shortness of breath dyspnea    with exertion    Skin cancer    areas removed   Status post dilation of esophageal narrowing    Past Surgical History:  Procedure Laterality Date   CARDIAC CATHETERIZATION     COLONOSCOPY  08/2001   diverticulosis, internal hemorrhoids   COLONOSCOPY WITH PROPOFOL  N/A 12/07/2015   Procedure: COLONOSCOPY WITH PROPOFOL ;  Surgeon: Kenney Peacemaker, MD;  Location: [facility];  Service: Endoscopy;  Laterality: N/A;   COLONOSCOPY WITH PROPOFOL  N/A 02/20/2017   Procedure: COLONOSCOPY WITH PROPOFOL ;  Surgeon: Kenney Peacemaker, MD;  Location: [facility];  Service: Endoscopy;  Laterality: N/A;   CORONARY ANGIOPLASTY WITH STENT PLACEMENT  2000   stent x 1     ESOPHAGOGASTRODUODENOSCOPY  01/2008   with esophageal dilation - esophageal dysmotility + stenosis, also benign fundic gland gastric polyps   FLEXIBLE SIGMOIDOSCOPY N/A 12/24/2015   Procedure: FLEXIBLE SIGMOIDOSCOPY;  Surgeon: Kenney Peacemaker, MD;  Location: [facility];  Service: Endoscopy;  Laterality: N/A;   INGUINAL HERNIA REPAIR Right yrs ago   SKIN CANCER EXCISION     TONSILLECTOMY     Patient Active Problem List   Diagnosis Date Noted   Hx of adenomatous colonic polyps    Dyspnea 10/15/2010   Abdominal bloating 03/15/2010   Aortic valve disorder 02/25/2010   Coronary atherosclerosis 02/20/2010   HYPERLIPIDEMIA-MIXED 02/19/2010   Essential hypertension 02/19/2010   ESOPHAGEAL MOTILITY DISORDER 01/28/2008   GERD 01/28/2008    PCP: Bertha Broad, MD  REFERRING PROVIDER: Arnie Lao, MD  REFERRING DIAG:  Diagnosis  M54.42,M54.41,G89.29 (ICD-10-CM) - Chronic bilateral low back pain with bilateral sciatica  M79.605 (ICD-10-CM) - Pain in left leg   Rationale for Evaluation and Treatment: Rehabilitation  THERAPY DIAG:  Other low back pain - Plan: PT plan of care cert/re-cert  Pain in right leg - Plan: PT plan of care cert/re-cert  Radiculopathy, lumbar region - Plan: PT plan of care cert/re-cert  Muscle  weakness (generalized) - Plan: PT plan of care cert/re-cert  Cramp and spasm - Plan: PT plan of care cert/re-cert  Difficulty in walking, not elsewhere classified - Plan: PT plan of care cert/re-cert  Abnormal posture - Plan: PT plan of care cert/re-cert  ONSET DATE: 09/14/2023  SUBJECTIVE:                                                                                                                                                                                           SUBJECTIVE STATEMENT: Patient reports he has been having back pain for many years.  He was on Benzo and hydrocodone for many years but got tired of taking them and so he stopped  "cold [place]".  My left arm has spasms and I also have neck pain that causes bilateral arm pain.  He feels that the nerves in his neck and in his back are causing his arm and leg pain.  Patient states he enjoys walking and has not been able to do this for a long time.  His goal is to build the strength in his legs enough to where he won't have to stop and rest so frequently and so that he can start walking again.  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  He sleeps with oxygen at night due to this.  He ambulates with 2 SPC's but is able to walk without them for short distances.    PERTINENT HISTORY:  Multiple orthopedic and medical issues.   PAIN:  Are you having pain? Yes: NPRS scale: Currently 0 but can get as bad as a 6/10 Pain location: low back and hips, buttocks Pain description: aching Aggravating factors: standing or walking for long periods of time Relieving factors: rest  PRECAUTIONS: Fall  RED FLAGS: None   WEIGHT BEARING RESTRICTIONS: No  FALLS:  Has patient fallen in last 6 months? No  LIVING ENVIRONMENT: Lives with: lives with their spouse Lives in: House/apartment Stairs: Yes: Internal: 12 steps; on right going up and External: 4 steps; on right going up Has following equipment at home: 2 single point canes  OCCUPATION: retired  PLOF: Independent, Independent with basic ADLs, Independent with household mobility without device, Independent with community mobility without device, Independent with homemaking with ambulation, Independent with gait, and Independent with transfers  PATIENT GOALS: His goal is to build the strength in his legs enough to where he won't have to stop and rest so frequently and so that he can start walking again  NEXT MD VISIT: as needed  OBJECTIVE:  Note: Objective measures were completed at Evaluation  unless otherwise noted.  DIAGNOSTIC FINDINGS:  09/07/23 IMPRESSION: Mild non-compressive disc  bulges throughout the lumbar region as outlined above. Mild facet hypertrophy at L4-5. Mild bilateral foraminal narrowing at L4-5, not likely compressive.  PATIENT SURVEYS:  Modified Oswestry 23/50= 46%   COGNITION: Overall cognitive status: Within functional limits for tasks assessed     SENSATION: WFL  MUSCLE LENGTH: Hamstrings: Right 50 deg; Left 50 deg Thomas test: Right pos; Left pos  POSTURE: rounded shoulders, forward head, decreased lumbar lordosis, increased thoracic kyphosis, posterior pelvic tilt, and flexed trunk   PALPATION: na  LUMBAR ROM:   AROM eval  Flexion Fingertips to distal shin but with knees flexed  Extension 50%  Right lateral flexion Fingertips to joint line  Left lateral flexion Fingertips to just above joint line  Right rotation 50%  Left rotation 50%   (Blank rows = not tested)  LOWER EXTREMITY ROM:     All anterior   LOWER EXTREMITY MMT:    Generally 4- to 4/5, hip extensors 3+/5 bilaterally, hip ER's 4-/5  LUMBAR SPECIAL TESTS:  Straight leg raise test: Negative  FUNCTIONAL TESTS:  5 times sit to stand: 10.42 sec Timed up and go (TUG): 14.30 sec  GAIT: Distance walked: 30 Assistive device utilized: 2 single point canes Level of assistance: SBA Comments: unsteady   TREATMENT DATE: 09/27/23  Initial eval complete and initiated HEP                                                                                                                                PATIENT EDUCATION:  Education details: Educated on posture and risk of natural fusion,  effect of sleeping in a recliner on posture and the difficulty of positioning to do effective core work.  Discussed aquatic therapy as an option if he is not responding well to land PT.  Person educated: Patient Education method: Explanation, Demonstration, and Verbal cues Education comprehension: verbalized understanding, returned demonstration, and verbal cues required  HOME EXERCISE  PROGRAM: Only had time to teach hamstring stretching due to heavy education on posture.   ASSESSMENT:  CLINICAL IMPRESSION: Patient is a 83 y.o. male who was seen today for physical therapy evaluation and treatment for low back, hip and buttock pain bilaterally. Posturally, he is unable to stand upright with severe fwd head, rounded shoulders and flexed trunk.  He is weak at proximal hip musculature.  He ambulates with short step length with 2 single point canes but can walk short distances without the canes safely.  He would benefit from skilled PT for LE flexibility and core stabilization.  MRI findings reveal disc bulges and only mild foraminal narrowing.  Therefore, he may benefit from some form of Mckenzie extension.  DN may be incorporated if indicated.  If he does not tolerate land PT, we may transition to aquatics therapy.     OBJECTIVE IMPAIRMENTS: Abnormal gait, decreased activity tolerance, decreased balance, decreased mobility, difficulty  walking, decreased ROM, decreased strength, hypomobility, increased fascial restrictions, increased muscle spasms, impaired flexibility, improper body mechanics, postural dysfunction, and pain.   ACTIVITY LIMITATIONS: carrying, lifting, bending, standing, squatting, sleeping, stairs, transfers, bed mobility, bathing, and dressing  PARTICIPATION LIMITATIONS: meal prep, cleaning, laundry, driving, shopping, community activity, yard work, and church  PERSONAL FACTORS: Fitness, Past/current experiences, Time since onset of injury/illness/exacerbation, and 3+ comorbidities: CAD, GERD, Htn, cervical spinal stenosis are also affecting patient's functional outcome.   REHAB POTENTIAL: Fair due to multiple co-morbidities  CLINICAL DECISION MAKING: Evolving/moderate complexity  EVALUATION COMPLEXITY: Moderate   GOALS: Goals reviewed with patient? Yes  SHORT TERM GOALS: Target date: 10/25/2023  Pain report to be no greater than 4/10  Baseline: Goal  status: INITIAL  2.  Patient will be independent with initial HEP  Baseline:  Goal status: INITIAL   LONG TERM GOALS: Target date: 11/22/2023  Patient to report pain no greater than 2/10  Baseline:  Goal status: INITIAL  2.  Patient to be independent with advanced HEP  Baseline:  Goal status: INITIAL  3.  Patient to report 85% improvement in overall symptoms Baseline:  Goal status: INITIAL  4.  Patient to be able to stand or walk for at least 15 min with pain no greater than 2/10  Baseline:  Goal status: INITIAL  5.  ODI to improve to 30% or better Baseline:  Goal status: INITIAL  6.  Functional scores to improve by 2-3 seconds Baseline:  Goal status: INITIAL  PLAN:  PT FREQUENCY: 1-2x/week  PT DURATION: 8 weeks  PLANNED INTERVENTIONS: 97110-Therapeutic exercises, 97530- Therapeutic activity, [id number]- Neuromuscular re-education, 97535- Self Care, 56213- Manual therapy, [phone]- Gait training, [phone]- Aquatic Therapy, [phone]- Electrical stimulation (unattended), [phone]- Electrical stimulation (manual), [id number]- Ultrasound, [id number]- Traction (mechanical), [id number]- Ionotophoresis 4mg /ml Dexamethasone, Patient/Family education, Balance training, Stair training, Taping, Dry Needling, Joint mobilization, Spinal mobilization, Cryotherapy, and Moist heat.  PLAN FOR NEXT SESSION: Review HEP, Nustep, begin core strengthening  Dreyden Rohrman B. Nicolena Schurman, PT 09/27/23 3:11 PM [facility] [address] [place], [place] [address] Phone # [phone] Fax [phone]

## 2023-09-29 ENCOUNTER — Ambulatory Visit

## 2023-09-29 DIAGNOSIS — R293 Abnormal posture: Secondary | ICD-10-CM

## 2023-09-29 DIAGNOSIS — R262 Difficulty in walking, not elsewhere classified: Secondary | ICD-10-CM

## 2023-09-29 DIAGNOSIS — M79604 Pain in right leg: Secondary | ICD-10-CM

## 2023-09-29 DIAGNOSIS — M6281 Muscle weakness (generalized): Secondary | ICD-10-CM

## 2023-09-29 DIAGNOSIS — M5459 Other low back pain: Secondary | ICD-10-CM

## 2023-09-29 DIAGNOSIS — M5416 Radiculopathy, lumbar region: Secondary | ICD-10-CM

## 2023-09-29 DIAGNOSIS — R252 Cramp and spasm: Secondary | ICD-10-CM

## 2023-09-29 NOTE — Therapy (Signed)
 OUTPATIENT PHYSICAL THERAPY THORACOLUMBAR TREATMENT   Patient Name: Todd Stevens MRN: [id number] DOB:1940/07/27, 83 y.o., male Today's Date: 09/30/2023  END OF SESSION:  PT End of Session - 09/29/23 0854     Visit Number 2    Date for PT Re-Evaluation 11/22/23    Authorization Type UHC    Progress Note Due on Visit 10    PT Start Time [phone]    PT Stop Time 0930    PT Time Calculation (min) 43 min    Activity Tolerance Patient tolerated treatment well    Behavior During Therapy [facility] for tasks assessed/performed             Past Medical History:  Diagnosis Date   Anemia    none since 2017   Aortic valve sclerosis    CAD (coronary artery disease)    Cervical disc disease    neuropathy   Diverticulosis    Elevated hemidiaphragm    right, 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   Obesity    Pneumonia 01/2010   Hospitalized   Restless leg syndrome    Shortness of breath dyspnea    with exertion    Skin cancer    areas removed   Status post dilation of esophageal narrowing    Past Surgical History:  Procedure Laterality Date   CARDIAC CATHETERIZATION     COLONOSCOPY  08/2001   diverticulosis, internal hemorrhoids   COLONOSCOPY WITH PROPOFOL  N/A 12/07/2015   Procedure: COLONOSCOPY WITH PROPOFOL ;  Surgeon: Kenney Peacemaker, MD;  Location: [facility];  Service: Endoscopy;  Laterality: N/A;   COLONOSCOPY WITH PROPOFOL  N/A 02/20/2017   Procedure: COLONOSCOPY WITH PROPOFOL ;  Surgeon: Kenney Peacemaker, MD;  Location: [facility];  Service: Endoscopy;  Laterality: N/A;   CORONARY ANGIOPLASTY WITH  STENT PLACEMENT  2000   stent x 1    ESOPHAGOGASTRODUODENOSCOPY  01/2008   with esophageal dilation - esophageal dysmotility + stenosis, also benign fundic gland gastric polyps   FLEXIBLE SIGMOIDOSCOPY N/A 12/24/2015   Procedure: FLEXIBLE SIGMOIDOSCOPY;  Surgeon: Kenney Peacemaker, MD;  Location: [facility];  Service: Endoscopy;  Laterality: N/A;   INGUINAL HERNIA REPAIR Right yrs ago   SKIN CANCER EXCISION     TONSILLECTOMY     Patient Active Problem List   Diagnosis Date Noted   Hx of adenomatous colonic polyps    Dyspnea 10/15/2010   Abdominal bloating 03/15/2010   Aortic valve disorder 02/25/2010   Coronary atherosclerosis 02/20/2010   HYPERLIPIDEMIA-MIXED 02/19/2010   Essential hypertension 02/19/2010   ESOPHAGEAL MOTILITY DISORDER 01/28/2008   GERD 01/28/2008    PCP: Bertha Broad, MD  REFERRING PROVIDER: Arnie Lao, MD  REFERRING DIAG:  Diagnosis  M54.42,M54.41,G89.29 (ICD-10-CM) - Chronic bilateral low back pain with bilateral sciatica  M79.605 (ICD-10-CM) - Pain in left leg   Rationale for Evaluation and Treatment: Rehabilitation  THERAPY DIAG:  Other low back pain  Pain in right leg  Radiculopathy, lumbar region  Muscle weakness (generalized)  Difficulty in walking, not elsewhere classified  Cramp and  spasm  Abnormal posture  ONSET DATE: 09/14/2023  SUBJECTIVE:                                                                                                                                                                                           SUBJECTIVE STATEMENT: Patient reports he isn't feeling too good this morning.  "I didn't sleep good so I just feel a little light headed".    PERTINENT HISTORY:  Multiple orthopedic and medical issues.   PAIN:  Are you having pain? Yes: NPRS scale: Currently 0 but can get as bad as a 6/10 Pain location: low back and hips, buttocks Pain description: aching Aggravating factors: standing or  walking for long periods of time Relieving factors: rest  PRECAUTIONS: Fall  RED FLAGS: None   WEIGHT BEARING RESTRICTIONS: No  FALLS:  Has patient fallen in last 6 months? No  LIVING ENVIRONMENT: Lives with: lives with their spouse Lives in: House/apartment Stairs: Yes: Internal: 12 steps; on right going up and External: 4 steps; on right going up Has following equipment at home: 2 single point canes  OCCUPATION: retired  PLOF: Independent, Independent with basic ADLs, Independent with household mobility without device, Independent with community mobility without device, Independent with homemaking with ambulation, Independent with gait, and Independent with transfers  PATIENT GOALS: His goal is to build the strength in his legs enough to where he won't have to stop and rest so frequently and so that he can start walking again  NEXT MD VISIT: as needed  OBJECTIVE:  Note: Objective measures were completed at Evaluation unless otherwise noted.  DIAGNOSTIC FINDINGS:  09/07/23 IMPRESSION: Mild non-compressive disc bulges throughout the lumbar region as outlined above. Mild facet hypertrophy at L4-5. Mild bilateral foraminal narrowing at L4-5, not likely compressive.  PATIENT SURVEYS:  Modified Oswestry 23/50= 46%   COGNITION: Overall cognitive status: Within functional limits for tasks assessed     SENSATION: WFL  MUSCLE LENGTH: Hamstrings: Right 50 deg; Left 50 deg Thomas test: Right pos; Left pos  POSTURE: rounded shoulders, forward head, decreased lumbar lordosis, increased thoracic kyphosis, posterior pelvic tilt, and flexed trunk   PALPATION: na  LUMBAR ROM:   AROM eval  Flexion Fingertips to distal shin but with knees flexed  Extension 50%  Right lateral flexion Fingertips to joint line  Left lateral flexion Fingertips to just above joint line  Right rotation 50%  Left rotation 50%   (Blank rows = not tested)  LOWER EXTREMITY ROM:     All anterior    LOWER EXTREMITY MMT:    Generally 4- to 4/5, hip extensors 3+/5 bilaterally, hip  ER's 4-/5  LUMBAR SPECIAL TESTS:  Straight leg raise test: Negative  FUNCTIONAL TESTS:  5 times sit to stand: 10.42 sec Timed up and go (TUG): 14.30 sec  GAIT: Distance walked: 30 Assistive device utilized: 2 single point canes Level of assistance: SBA Comments: unsteady   TREATMENT DATE: 09/29/23 Nustep x 5 min level 3 (PT present to discuss status) Seated hamstring stretch 3 x 30 sec Seated hip ER stretch with strap 3 x 30 sec Seated mini sit up with 5 lb kb 2 x 10 Seated in reclined position: modified Guernsey Twist x 20 Seated Hip to shoulder with 5 lb kb x 10 each  Standing hip flexor/quad stretch 3 x 30 sec Sit to stand 2 x 5 Seated trunk extension 2 x 10 with red steel band  Seated rows with red tband with handles (PT anchoring middle) x 20 Seated horizontal shoulder abduction with red band 2 x 10  TREATMENT DATE: 09/27/23  Initial eval complete and initiated HEP                                                                                                                                PATIENT EDUCATION:  Education details: Educated on posture and risk of natural fusion,  effect of sleeping in a recliner on posture and the difficulty of positioning to do effective core work.  Discussed aquatic therapy as an option if he is not responding well to land PT.  Person educated: Patient Education method: Explanation, Demonstration, and Verbal cues Education comprehension: verbalized understanding, returned demonstration, and verbal cues required  HOME EXERCISE PROGRAM: Only had time to teach hamstring stretching due to heavy education on posture.   ASSESSMENT:  CLINICAL IMPRESSION: Todd Stevens did well with all tasks today despite not feeling well.  BP was 137/72 and HR 73.  He is working on improving his posture.  He has severe fwd head and kyphosis.  He had no pain at end of session and  reported "feeling better".  He would benefit from continuing skilled PT for postural training and strengthening along with functional strengthening, gait training and fall prevention.   OBJECTIVE IMPAIRMENTS: Abnormal gait, decreased activity tolerance, decreased balance, decreased mobility, difficulty walking, decreased ROM, decreased strength, hypomobility, increased fascial restrictions, increased muscle spasms, impaired flexibility, improper body mechanics, postural dysfunction, and pain.   ACTIVITY LIMITATIONS: carrying, lifting, bending, standing, squatting, sleeping, stairs, transfers, bed mobility, bathing, and dressing  PARTICIPATION LIMITATIONS: meal prep, cleaning, laundry, driving, shopping, community activity, yard work, and church  PERSONAL FACTORS: Fitness, Past/current experiences, Time since onset of injury/illness/exacerbation, and 3+ comorbidities: CAD, GERD, Htn, cervical spinal stenosis are also affecting patient's functional outcome.   REHAB POTENTIAL: Fair due to multiple co-morbidities  CLINICAL DECISION MAKING: Evolving/moderate complexity  EVALUATION COMPLEXITY: Moderate   GOALS: Goals reviewed with patient? Yes  SHORT TERM GOALS: Target date: 10/25/2023  Pain report to be no greater than 4/10  Baseline: Goal status:  INITIAL  2.  Patient will be independent with initial HEP  Baseline:  Goal status: INITIAL   LONG TERM GOALS: Target date: 11/22/2023  Patient to report pain no greater than 2/10  Baseline:  Goal status: INITIAL  2.  Patient to be independent with advanced HEP  Baseline:  Goal status: INITIAL  3.  Patient to report 85% improvement in overall symptoms Baseline:  Goal status: INITIAL  4.  Patient to be able to stand or walk for at least 15 min with pain no greater than 2/10  Baseline:  Goal status: INITIAL  5.  ODI to improve to 30% or better Baseline:  Goal status: INITIAL  6.  Functional scores to improve by 2-3  seconds Baseline:  Goal status: INITIAL  PLAN:  PT FREQUENCY: 1-2x/week  PT DURATION: 8 weeks  PLANNED INTERVENTIONS: 97110-Therapeutic exercises, 97530- Therapeutic activity, [id number]- Neuromuscular re-education, 97535- Self Care, 16109- Manual therapy, [phone]- Gait training, [phone]- Aquatic Therapy, [phone]- Electrical stimulation (unattended), [phone]- Electrical stimulation (manual), [id number]- Ultrasound, [id number]- Traction (mechanical), [id number]- Ionotophoresis 4mg /ml Dexamethasone, Patient/Family education, Balance training, Stair training, Taping, Dry Needling, Joint mobilization, Spinal mobilization, Cryotherapy, and Moist heat.  PLAN FOR NEXT SESSION:  Nustep, core strengthening, flexibility  Demiya Magno B. Reilyn Nelson, PT 09/30/23 8:47 AM [facility] [address] [place], [place] [address] Phone # [phone] Fax [phone]

## 2023-10-08 NOTE — Therapy (Signed)
 OUTPATIENT PHYSICAL THERAPY THORACOLUMBAR TREATMENT   Patient Name: Todd Stevens MRN: [id number] DOB:13-Sep-1940, 83 y.o., male Today's Date: 10/09/2023  END OF SESSION:  PT End of Session - 10/09/23 0939     Visit Number 3    Date for PT Re-Evaluation 11/22/23    Authorization Type U[employer]    Progress Note Due on Visit 10    PT Start Time [phone]    PT Stop Time 0930    PT Time Calculation (min) 43 min    Activity Tolerance Patient tolerated treatment well    Behavior During Therapy [facility] for tasks assessed/performed              Past Medical History:  Diagnosis Date   Anemia    none since 2017   Aortic valve sclerosis    CAD (coronary artery disease)    Cervical disc disease    neuropathy   Diverticulosis    Elevated hemidiaphragm    right, 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   Obesity    Pneumonia 01/2010   Hospitalized   Restless leg syndrome    Shortness of breath dyspnea    with exertion    Skin cancer    areas removed   Status post dilation of esophageal narrowing    Past Surgical History:  Procedure Laterality Date   CARDIAC CATHETERIZATION     COLONOSCOPY  08/2001   diverticulosis, internal hemorrhoids   COLONOSCOPY WITH PROPOFOL  N/A 12/07/2015   Procedure: COLONOSCOPY WITH PROPOFOL ;  Surgeon: Kenney Peacemaker, MD;  Location: [facility];  Service: Endoscopy;  Laterality: N/A;   COLONOSCOPY WITH PROPOFOL  N/A 02/20/2017   Procedure: COLONOSCOPY WITH PROPOFOL ;  Surgeon: Kenney Peacemaker, MD;  Location: [facility];  Service: Endoscopy;  Laterality: N/A;   CORONARY ANGIOPLASTY WITH  STENT PLACEMENT  2000   stent x 1    ESOPHAGOGASTRODUODENOSCOPY  01/2008   with esophageal dilation - esophageal dysmotility + stenosis, also benign fundic gland gastric polyps   FLEXIBLE SIGMOIDOSCOPY N/A 12/24/2015   Procedure: FLEXIBLE SIGMOIDOSCOPY;  Surgeon: Kenney Peacemaker, MD;  Location: [facility];  Service: Endoscopy;  Laterality: N/A;   INGUINAL HERNIA REPAIR Right yrs ago   SKIN CANCER EXCISION     TONSILLECTOMY     Patient Active Problem List   Diagnosis Date Noted   Hx of adenomatous colonic polyps    Dyspnea 10/15/2010   Abdominal bloating 03/15/2010   Aortic valve disorder 02/25/2010   Coronary atherosclerosis 02/20/2010   HYPERLIPIDEMIA-MIXED 02/19/2010   Essential hypertension 02/19/2010   ESOPHAGEAL MOTILITY DISORDER 01/28/2008   GERD 01/28/2008    PCP: Bertha Broad, MD  REFERRING PROVIDER: Arnie Lao, MD  REFERRING DIAG:  Diagnosis  M54.42,M54.41,G89.29 (ICD-10-CM) - Chronic bilateral low back pain with bilateral sciatica  M79.605 (ICD-10-CM) - Pain in left leg   Rationale for Evaluation and Treatment: Rehabilitation  THERAPY DIAG:  Other low back pain  Pain in right leg  Radiculopathy, lumbar region  Muscle weakness (generalized)  ONSET DATE: 09/14/2023  SUBJECTIVE:  SUBJECTIVE STATEMENT: I feel weak. I took a week of therapy and I'm weak and shaky. I haven't slept all week. Caregiving for wife.    PERTINENT HISTORY:  Multiple orthopedic and medical issues.   PAIN:  Are you having pain? Yes: NPRS scale: Currently 0 but can get as bad as a 6/10 Pain location: low back and hips, buttocks Pain description: aching Aggravating factors: standing or walking for long periods of time Relieving factors: rest  PRECAUTIONS: Fall  RED  FLAGS: None   WEIGHT BEARING RESTRICTIONS: No  FALLS:  Has patient fallen in last 6 months? No  LIVING ENVIRONMENT: Lives with: lives with their spouse Lives in: House/apartment Stairs: Yes: Internal: 12 steps; on right going up and External: 4 steps; on right going up Has following equipment at home: 2 single point canes  OCCUPATION: retired  PLOF: Independent, Independent with basic ADLs, Independent with household mobility without device, Independent with community mobility without device, Independent with homemaking with ambulation, Independent with gait, and Independent with transfers  PATIENT GOALS: His goal is to build the strength in his legs enough to where he won't have to stop and rest so frequently and so that he can start walking again  NEXT MD VISIT: as needed  OBJECTIVE:  Note: Objective measures were completed at Evaluation unless otherwise noted.  DIAGNOSTIC FINDINGS:  09/07/23 IMPRESSION: Mild non-compressive disc bulges throughout the lumbar region as outlined above. Mild facet hypertrophy at L4-5. Mild bilateral foraminal narrowing at L4-5, not likely compressive.  PATIENT SURVEYS:  Modified Oswestry 23/50= 46%   COGNITION: Overall cognitive status: Within functional limits for tasks assessed     SENSATION: WFL  MUSCLE LENGTH: Hamstrings: Right 50 deg; Left 50 deg Thomas test: Right pos; Left pos  POSTURE: rounded shoulders, forward head, decreased lumbar lordosis, increased thoracic kyphosis, posterior pelvic tilt, and flexed trunk   PALPATION: na  LUMBAR ROM:   AROM eval  Flexion Fingertips to distal shin but with knees flexed  Extension 50%  Right lateral flexion Fingertips to joint line  Left lateral flexion Fingertips to just above joint line  Right rotation 50%  Left rotation 50%   (Blank rows = not tested)  LOWER EXTREMITY ROM:     All anterior   LOWER EXTREMITY MMT:    Generally 4- to 4/5, hip extensors 3+/5 bilaterally, hip  ER's 4-/5  LUMBAR SPECIAL TESTS:  Straight leg raise test: Negative  FUNCTIONAL TESTS:  5 times sit to stand: 10.42 sec Timed up and go (TUG): 14.30 sec  GAIT: Distance walked: 30 Assistive device utilized: 2 single point canes Level of assistance: SBA Comments: unsteady    TREATMENT DATE: 10/09/23 Nustep x 5 min level 3 LEGS only hurt shoulders last time (PT present to discuss status) Seated hamstring stretch 3 x 30 sec Seated hip ER stretch with strap 1 x 30 sec Seated mini sit up with 5 lb  2 x 10 Seated in reclined position: modified Guernsey Twist 2x 8 with blue plyo ball Seated Hip to shoulder with 5 lb kb x 10 each  Sit to stand 2 x 10 from mat + foam Seated trunk extension 2 x 10 with red steel band  Seated rows with blue tband ) x 20 Standing blue shoulder ext with scapular squeeze x 20 Seated horizontal shoulder abduction with blue band 2 x 10 Seated hip flexor/quad stretch 3 x 30 sec  TREATMENT DATE: 09/29/23 Nustep x 5 min level 3 (PT present to discuss status) Seated  hamstring stretch 3 x 30 sec Seated hip ER stretch with strap 3 x 30 sec Seated mini sit up with 5 lb kb 2 x 10 Seated in reclined position: modified Guernsey Twist x 20 Seated Hip to shoulder with 5 lb kb x 10 each  Standing hip flexor/quad stretch 3 x 30 sec Sit to stand 2 x 5 Seated trunk extension 2 x 10 with red steel band  Seated rows with red tband with handles (PT anchoring middle) x 20 Seated horizontal shoulder abduction with red band 2 x 10  TREATMENT DATE: 09/27/23  Initial eval complete and initiated HEP                                                                                                                                PATIENT EDUCATION:  Education details: Educated on posture and risk of natural fusion,  effect of sleeping in a recliner on posture and the difficulty of positioning to do effective core work.  Discussed aquatic therapy as an option if he is not responding well  to land PT.  Person educated: Patient Education method: Explanation, Demonstration, and Verbal cues Education comprehension: verbalized understanding, returned demonstration, and verbal cues required  HOME EXERCISE PROGRAM: Access Code: [email or web address] URL: [email or web address] Date: 10/09/2023 Prepared by: Concha Deed  Exercises - Sit to Stand  - 3 x daily - 7 x weekly - 1-2 sets - 10 reps - Standing Shoulder Horizontal Abduction with Resistance  - 1 x daily - 3 x weekly - 1-3 sets - 10 reps - Standing Shoulder Row with Anchored Resistance  - 1 x daily - 3 x weekly - 1-3 sets - 10 reps - Shoulder extension with resistance - Neutral  - 1 x daily - 3 x weekly - 1-3 sets - 10 reps - Seated Shoulder Horizontal Abduction with Resistance - Thumbs Up  - 1 x daily - 3 x weekly - 1-3 sets - 10 reps   ASSESSMENT:  CLINICAL IMPRESSION: Patient continues to report unsteadiness and general weakness today. Advised patient to use his cane which he had in the car but did not bring into PT. Also advised increasing water intake which he states he is not good at staying hydrated. He tolerated all TE well. O2Sats and vitals checked intermittently and were WNL. Patient is very stressed regarding his wife's health and this limits his ability to sleep and do his HEP. He plans to f/u with his MD for both of them today.  OBJECTIVE IMPAIRMENTS: Abnormal gait, decreased activity tolerance, decreased balance, decreased mobility, difficulty walking, decreased ROM, decreased strength, hypomobility, increased fascial restrictions, increased muscle spasms, impaired flexibility, improper body mechanics, postural dysfunction, and pain.   ACTIVITY LIMITATIONS: carrying, lifting, bending, standing, squatting, sleeping, stairs, transfers, bed mobility, bathing, and dressing  PARTICIPATION LIMITATIONS: meal prep, cleaning, laundry, driving, shopping, community activity, yard work, and church  PERSONAL FACTORS: Fitness,  Past/current experiences, Time since onset of injury/illness/exacerbation, and  3+ comorbidities: CAD, GERD, Htn, cervical spinal stenosis are also affecting patient's functional outcome.   REHAB POTENTIAL: Fair due to multiple co-morbidities  CLINICAL DECISION MAKING: Evolving/moderate complexity  EVALUATION COMPLEXITY: Moderate   GOALS: Goals reviewed with patient? Yes  SHORT TERM GOALS: Target date: 10/25/2023  Pain report to be no greater than 4/10  Baseline: Goal status: INITIAL  2.  Patient will be independent with initial HEP  Baseline:  Goal status: INITIAL   LONG TERM GOALS: Target date: 11/22/2023  Patient to report pain no greater than 2/10  Baseline:  Goal status: INITIAL  2.  Patient to be independent with advanced HEP  Baseline:  Goal status: INITIAL  3.  Patient to report 85% improvement in overall symptoms Baseline:  Goal status: INITIAL  4.  Patient to be able to stand or walk for at least 15 min with pain no greater than 2/10  Baseline:  Goal status: INITIAL  5.  ODI to improve to 30% or better Baseline:  Goal status: INITIAL  6.  Functional scores to improve by 2-3 seconds Baseline:  Goal status: INITIAL  PLAN:  PT FREQUENCY: 1-2x/week  PT DURATION: 8 weeks  PLANNED INTERVENTIONS: 97110-Therapeutic exercises, 97530- Therapeutic activity, [id number]- Neuromuscular re-education, 97535- Self Care, 16109- Manual therapy, [phone]- Gait training, [phone]- Aquatic Therapy, [phone]- Electrical stimulation (unattended), [phone]- Electrical stimulation (manual), [id number]- Ultrasound, [id number]- Traction (mechanical), [id number]- Ionotophoresis 4mg /ml Dexamethasone, Patient/Family education, Balance training, Stair training, Taping, Dry Needling, Joint mobilization, Spinal mobilization, Cryotherapy, and Moist heat.  PLAN FOR NEXT SESSION:  Nustep, core strengthening, flexibility  Jinx Mourning, PT  10/09/23 1:37 PM [facility] [address]  [address] [place], [place] [address] Phone # [phone] Fax [phone]

## 2023-10-09 ENCOUNTER — Ambulatory Visit: Attending: Orthopaedic Surgery | Admitting: Physical Therapy

## 2023-10-09 ENCOUNTER — Encounter: Payer: Self-pay | Admitting: Physical Therapy

## 2023-10-09 VITALS — BP 131/69 | HR 80

## 2023-10-09 DIAGNOSIS — R293 Abnormal posture: Secondary | ICD-10-CM | POA: Diagnosis present

## 2023-10-09 DIAGNOSIS — M79604 Pain in right leg: Secondary | ICD-10-CM | POA: Diagnosis present

## 2023-10-09 DIAGNOSIS — M6281 Muscle weakness (generalized): Secondary | ICD-10-CM

## 2023-10-09 DIAGNOSIS — M5459 Other low back pain: Secondary | ICD-10-CM | POA: Diagnosis present

## 2023-10-09 DIAGNOSIS — R262 Difficulty in walking, not elsewhere classified: Secondary | ICD-10-CM | POA: Insufficient documentation

## 2023-10-09 DIAGNOSIS — R252 Cramp and spasm: Secondary | ICD-10-CM | POA: Insufficient documentation

## 2023-10-09 DIAGNOSIS — M5416 Radiculopathy, lumbar region: Secondary | ICD-10-CM

## 2023-10-10 NOTE — Therapy (Signed)
 OUTPATIENT PHYSICAL THERAPY THORACOLUMBAR TREATMENT   Patient Name: Todd Stevens MRN: [id number] DOB:Nov 01, 1940, 83 y.o., male Today's Date: 10/11/2023  END OF SESSION:  PT End of Session - 10/11/23 0843     Visit Number 4    Date for PT Re-Evaluation 11/22/23    Authorization Type UHC    Progress Note Due on Visit 10    PT Start Time [phone]    PT Stop Time 0927    PT Time Calculation (min) 43 min    Activity Tolerance Patient tolerated treatment well    Behavior During Therapy [facility] for tasks assessed/performed               Past Medical History:  Diagnosis Date   Anemia    none since 2017   Aortic valve sclerosis    CAD (coronary artery disease)    Cervical disc disease    neuropathy   Diverticulosis    Elevated hemidiaphragm    right, 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   Obesity    Pneumonia 01/2010   Hospitalized   Restless leg syndrome    Shortness of breath dyspnea    with exertion    Skin cancer    areas removed   Status post dilation of esophageal narrowing    Past Surgical History:  Procedure Laterality Date   CARDIAC CATHETERIZATION     COLONOSCOPY  08/2001   diverticulosis, internal hemorrhoids   COLONOSCOPY WITH PROPOFOL  N/A 12/07/2015   Procedure: COLONOSCOPY WITH PROPOFOL ;  Surgeon: Kenney Peacemaker, MD;  Location: [facility];  Service: Endoscopy;  Laterality: N/A;   COLONOSCOPY WITH PROPOFOL  N/A 02/20/2017   Procedure: COLONOSCOPY WITH PROPOFOL ;  Surgeon: Kenney Peacemaker, MD;  Location: [facility];  Service: Endoscopy;  Laterality: N/A;   CORONARY ANGIOPLASTY WITH  STENT PLACEMENT  2000   stent x 1    ESOPHAGOGASTRODUODENOSCOPY  01/2008   with esophageal dilation - esophageal dysmotility + stenosis, also benign fundic gland gastric polyps   FLEXIBLE SIGMOIDOSCOPY N/A 12/24/2015   Procedure: FLEXIBLE SIGMOIDOSCOPY;  Surgeon: Kenney Peacemaker, MD;  Location: [facility];  Service: Endoscopy;  Laterality: N/A;   INGUINAL HERNIA REPAIR Right yrs ago   SKIN CANCER EXCISION     TONSILLECTOMY     Patient Active Problem List   Diagnosis Date Noted   Hx of adenomatous colonic polyps    Dyspnea 10/15/2010   Abdominal bloating 03/15/2010   Aortic valve disorder 02/25/2010   Coronary atherosclerosis 02/20/2010   HYPERLIPIDEMIA-MIXED 02/19/2010   Essential hypertension 02/19/2010   ESOPHAGEAL MOTILITY DISORDER 01/28/2008   GERD 01/28/2008    PCP: Bertha Broad, MD  REFERRING PROVIDER: Arnie Lao, MD  REFERRING DIAG:  Diagnosis  M54.42,M54.41,G89.29 (ICD-10-CM) - Chronic bilateral low back pain with bilateral sciatica  M79.605 (ICD-10-CM) - Pain in left leg   Rationale for Evaluation and Treatment: Rehabilitation  THERAPY DIAG:  Other low back pain  Pain in right leg  Radiculopathy, lumbar region  Muscle weakness (generalized)  Difficulty in walking, not elsewhere classified  Cramp and spasm  Abnormal posture  ONSET DATE: 09/14/2023  SUBJECTIVE:                                                                                                                                                                                           SUBJECTIVE STATEMENT: Patient reports he slept better last night and feels better.   PERTINENT HISTORY:  Multiple orthopedic and medical issues.   PAIN:  Are you having pain? Yes: NPRS scale: Currently 0 but can get as bad as a 6/10 Pain location: low back and hips, buttocks Pain description: aching Aggravating factors: standing or walking for long periods of time Relieving factors:  rest  PRECAUTIONS: Fall  RED FLAGS: None   WEIGHT BEARING RESTRICTIONS: No  FALLS:  Has patient fallen in last 6 months? No  LIVING ENVIRONMENT: Lives with: lives with their spouse Lives in: House/apartment Stairs: Yes: Internal: 12 steps; on right going up and External: 4 steps; on right going up Has following equipment at home: 2 single point canes  OCCUPATION: retired  PLOF: Independent, Independent with basic ADLs, Independent with household mobility without device, Independent with community mobility without device, Independent with homemaking with ambulation, Independent with gait, and Independent with transfers  PATIENT GOALS: His goal is to build the strength in his legs enough to where he won't have to stop and rest so frequently and so that he can start walking again  NEXT MD VISIT: as needed  OBJECTIVE:  Note: Objective measures were completed at Evaluation unless otherwise noted.  DIAGNOSTIC FINDINGS:  09/07/23 IMPRESSION: Mild non-compressive disc bulges throughout the lumbar region as outlined above. Mild facet hypertrophy at L4-5. Mild bilateral foraminal narrowing at L4-5, not likely compressive.  PATIENT SURVEYS:  Modified Oswestry 23/50= 46%   COGNITION: Overall cognitive status: Within functional limits for tasks assessed     SENSATION: WFL  MUSCLE LENGTH: Hamstrings: Right 50 deg; Left 50 deg Thomas test: Right pos; Left pos  POSTURE: rounded shoulders, forward head, decreased lumbar lordosis, increased thoracic kyphosis, posterior pelvic tilt, and flexed trunk   PALPATION: na  LUMBAR ROM:   AROM eval  Flexion Fingertips to distal shin but with knees flexed  Extension 50%  Right lateral flexion Fingertips to joint line  Left lateral flexion Fingertips to just above joint line  Right rotation 50%  Left rotation 50%   (Blank rows = not tested)  LOWER EXTREMITY ROM:     All anterior   LOWER EXTREMITY MMT:    Generally 4- to 4/5,  hip extensors 3+/5 bilaterally, hip ER's 4-/5  LUMBAR SPECIAL TESTS:  Straight leg raise test:  Negative  FUNCTIONAL TESTS:  5 times sit to stand: 10.42 sec Timed up and go (TUG): 14.30 sec  GAIT: Distance walked: 30 Assistive device utilized: 2 single point canes Level of assistance: SBA Comments: unsteady   TREATMENT DATE: 10/11/23 Nustep x 5 min level 6  (PT present to discuss status) Seated hamstring stretch 3 x 30 sec Seated hip flexor/quad stretch 3 x 30 sec Sit to stand 2 x 10 from mat  Standing hip ABD, ext, march 2x10 ea B - cues for abdominal contraction to stabilize Standing heel raises x 20 Dynamic calf stretch at barre x 10 Seated mini sit up with 5 lb  2 x 10 Seated trunk extension 2 x 10 with red steel band  Standing rows with blue tband )x 20 Standing blue shoulder ext with scapular squeeze x 20 Seated horizontal shoulder abduction with green band 2 x 10    TREATMENT DATE: 10/09/23 Nustep x 5 min level 3 LEGS only hurt shoulders last time (PT present to discuss status) Seated hamstring stretch 3 x 30 sec Seated hip ER stretch with strap 1 x 30 sec Seated mini sit up with 5 lb  2 x 10 Seated in reclined position: modified Guernsey Twist 2x 8 with blue plyo ball Seated Hip to shoulder with 5 lb kb x 10 each  Sit to stand 2 x 10 from mat + foam Seated trunk extension 2 x 10 with red steel band  Seated rows with blue tband ) x 20 Standing blue shoulder ext with scapular squeeze x 20 Seated horizontal shoulder abduction with blue band 2 x 10 Seated hip flexor/quad stretch 3 x 30 sec  TREATMENT DATE: 09/29/23 Nustep x 5 min level 3 (PT present to discuss status) Seated hamstring stretch 3 x 30 sec Seated hip ER stretch with strap 3 x 30 sec Seated mini sit up with 5 lb kb 2 x 10 Seated in reclined position: modified Guernsey Twist x 20 Seated Hip to shoulder with 5 lb kb x 10 each  Standing hip flexor/quad stretch 3 x 30 sec Sit to stand 2 x 5 Seated trunk  extension 2 x 10 with red steel band  Seated rows with red tband with handles (PT anchoring middle) x 20 Seated horizontal shoulder abduction with red band 2 x 10  TREATMENT DATE: 09/27/23  Initial eval complete and initiated HEP                                                                                                                                PATIENT EDUCATION:  Education details: Educated on posture and risk of natural fusion,  effect of sleeping in a recliner on posture and the difficulty of positioning to do effective core work.  Discussed aquatic therapy as an option if he is not responding well to land PT.  Person educated: Patient Education method: Explanation, Demonstration, and Verbal cues Education comprehension: verbalized understanding, returned demonstration,  and verbal cues required  HOME EXERCISE PROGRAM: Access Code: [email or web address] URL: [email or web address] Date: 10/09/2023 Prepared by: Concha Deed  Exercises - Sit to Stand  - 3 x daily - 7 x weekly - 1-2 sets - 10 reps - Standing Shoulder Horizontal Abduction with Resistance  - 1 x daily - 3 x weekly - 1-3 sets - 10 reps - Standing Shoulder Row with Anchored Resistance  - 1 x daily - 3 x weekly - 1-3 sets - 10 reps - Shoulder extension with resistance - Neutral  - 1 x daily - 3 x weekly - 1-3 sets - 10 reps - Seated Shoulder Horizontal Abduction with Resistance - Thumbs Up  - 1 x daily - 3 x weekly - 1-3 sets - 10 reps   ASSESSMENT:  CLINICAL IMPRESSION:  Patient presents with [facility] today and much improved stability. He has significant tightness in his right hip flexor limiting R hip extension and causing some back pain with standing ext. This improved some with ab contraction. Patient did well with standing exercises and HR and O2 sats stayed within normal limits. Patient will try to do more of his HEP at home.  OBJECTIVE IMPAIRMENTS: Abnormal gait, decreased activity tolerance, decreased balance,  decreased mobility, difficulty walking, decreased ROM, decreased strength, hypomobility, increased fascial restrictions, increased muscle spasms, impaired flexibility, improper body mechanics, postural dysfunction, and pain.   ACTIVITY LIMITATIONS: carrying, lifting, bending, standing, squatting, sleeping, stairs, transfers, bed mobility, bathing, and dressing  PARTICIPATION LIMITATIONS: meal prep, cleaning, laundry, driving, shopping, community activity, yard work, and church  PERSONAL FACTORS: Fitness, Past/current experiences, Time since onset of injury/illness/exacerbation, and 3+ comorbidities: CAD, GERD, Htn, cervical spinal stenosis are also affecting patient's functional outcome.   REHAB POTENTIAL: Fair due to multiple co-morbidities  CLINICAL DECISION MAKING: Evolving/moderate complexity  EVALUATION COMPLEXITY: Moderate   GOALS: Goals reviewed with patient? Yes  SHORT TERM GOALS: Target date: 10/25/2023  Pain report to be no greater than 4/10  Baseline: Goal status: INITIAL  2.  Patient will be independent with initial HEP  Baseline:  Goal status: INITIAL   LONG TERM GOALS: Target date: 11/22/2023  Patient to report pain no greater than 2/10  Baseline:  Goal status: INITIAL  2.  Patient to be independent with advanced HEP  Baseline:  Goal status: INITIAL  3.  Patient to report 85% improvement in overall symptoms Baseline:  Goal status: INITIAL  4.  Patient to be able to stand or walk for at least 15 min with pain no greater than 2/10  Baseline:  Goal status: INITIAL  5.  ODI to improve to 30% or better Baseline:  Goal status: INITIAL  6.  Functional scores to improve by 2-3 seconds Baseline:  Goal status: INITIAL  PLAN:  PT FREQUENCY: 1-2x/week  PT DURATION: 8 weeks  PLANNED INTERVENTIONS: 97110-Therapeutic exercises, 97530- Therapeutic activity, [id number]- Neuromuscular re-education, 97535- Self Care, [id number]- Manual therapy, [phone]- Gait training, [phone]-  Aquatic Therapy, [phone]- Electrical stimulation (unattended), [phone]- Electrical stimulation (manual), [id number]- Ultrasound, [id number]- Traction (mechanical), [id number]- Ionotophoresis 4mg /ml Dexamethasone, Patient/Family education, Balance training, Stair training, Taping, Dry Needling, Joint mobilization, Spinal mobilization, Cryotherapy, and Moist heat.  PLAN FOR NEXT SESSION:  Nustep, core strengthening, flexibility  Jinx Mourning, PT  10/11/23 9:30 AM [facility] [address], [address] [place], [place] [address] Phone # [phone] Fax [phone]

## 2023-10-11 ENCOUNTER — Encounter: Payer: Self-pay | Admitting: Physical Therapy

## 2023-10-11 ENCOUNTER — Ambulatory Visit: Admitting: Physical Therapy

## 2023-10-11 DIAGNOSIS — M5459 Other low back pain: Secondary | ICD-10-CM

## 2023-10-11 DIAGNOSIS — R293 Abnormal posture: Secondary | ICD-10-CM

## 2023-10-11 DIAGNOSIS — M6281 Muscle weakness (generalized): Secondary | ICD-10-CM

## 2023-10-11 DIAGNOSIS — M79604 Pain in right leg: Secondary | ICD-10-CM

## 2023-10-11 DIAGNOSIS — R252 Cramp and spasm: Secondary | ICD-10-CM

## 2023-10-11 DIAGNOSIS — R262 Difficulty in walking, not elsewhere classified: Secondary | ICD-10-CM

## 2023-10-11 DIAGNOSIS — M5416 Radiculopathy, lumbar region: Secondary | ICD-10-CM

## 2023-10-17 NOTE — Therapy (Signed)
 OUTPATIENT PHYSICAL THERAPY THORACOLUMBAR TREATMENT   Patient Name: Todd Stevens MRN: [id number] DOB:10-14-1940, 83 y.o., male Today's Date: 10/18/2023  END OF SESSION:  PT End of Session - 10/18/23 1620     Visit Number 5    Date for PT Re-Evaluation 11/22/23    Authorization Type [employer]    Progress Note Due on Visit 10    PT Start Time 1617    PT Stop Time 1659    PT Time Calculation (min) 42 min    Activity Tolerance Patient tolerated treatment well    Behavior During Therapy WFL for tasks assessed/performed             Past Medical History:  Diagnosis Date   Anemia    none since 2017   Aortic valve sclerosis    CAD (coronary artery disease)    Cervical disc disease    neuropathy   Diverticulosis    Elevated hemidiaphragm    right, 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   Obesity    Pneumonia 01/2010   Hospitalized   Restless leg syndrome    Shortness of breath dyspnea    with exertion    Skin cancer    areas removed   Status post dilation of esophageal narrowing    Past Surgical History:  Procedure Laterality Date   CARDIAC CATHETERIZATION     COLONOSCOPY  08/2001   diverticulosis, internal hemorrhoids   COLONOSCOPY WITH PROPOFOL  N/A 12/07/2015   Procedure: COLONOSCOPY WITH PROPOFOL ;  Surgeon: Kenney Peacemaker, MD;  Location: [facility];  Service: Endoscopy;  Laterality: N/A;   COLONOSCOPY WITH PROPOFOL  N/A 02/20/2017   Procedure: COLONOSCOPY WITH PROPOFOL ;  Surgeon: Kenney Peacemaker, MD;  Location: [facility];  Service: Endoscopy;  Laterality: N/A;   CORONARY ANGIOPLASTY WITH  STENT PLACEMENT  2000   stent x 1    ESOPHAGOGASTRODUODENOSCOPY  01/2008   with esophageal dilation - esophageal dysmotility + stenosis, also benign fundic gland gastric polyps   FLEXIBLE SIGMOIDOSCOPY N/A 12/24/2015   Procedure: FLEXIBLE SIGMOIDOSCOPY;  Surgeon: Kenney Peacemaker, MD;  Location: [facility];  Service: Endoscopy;  Laterality: N/A;   INGUINAL HERNIA REPAIR Right yrs ago   SKIN CANCER EXCISION     TONSILLECTOMY     Patient Active Problem List   Diagnosis Date Noted   Hx of adenomatous colonic polyps    Dyspnea 10/15/2010   Abdominal bloating 03/15/2010   Aortic valve disorder 02/25/2010   Coronary atherosclerosis 02/20/2010   HYPERLIPIDEMIA-MIXED 02/19/2010   Essential hypertension 02/19/2010   ESOPHAGEAL MOTILITY DISORDER 01/28/2008   GERD 01/28/2008    PCP: Bertha Broad, MD  REFERRING PROVIDER: Arnie Lao, MD  REFERRING DIAG:  Diagnosis  M54.42,M54.41,G89.29 (ICD-10-CM) - Chronic bilateral low back pain with bilateral sciatica  M79.605 (ICD-10-CM) - Pain in left leg   Rationale for Evaluation and Treatment: Rehabilitation  THERAPY DIAG:  Other low back pain  Pain in right leg  Radiculopathy, lumbar region  Muscle weakness (generalized)  Difficulty in walking, not elsewhere classified  Cramp and  spasm  Abnormal posture  ONSET DATE: 09/14/2023  SUBJECTIVE:                                                                                                                                                                                           SUBJECTIVE STATEMENT: I think I overdid it yesterday. I have been doing my exercises more. I walked on the TM for 5 min x 3 times.   PERTINENT HISTORY:  Multiple orthopedic and medical issues.   PAIN:  Are you having pain? Yes: NPRS scale: Currently 0 but can get as bad as a 6/10 Pain location: low back and hips, buttocks Pain description: aching Aggravating factors: standing or walking  for long periods of time Relieving factors: rest  PRECAUTIONS: Fall  RED FLAGS: None   WEIGHT BEARING RESTRICTIONS: No  FALLS:  Has patient fallen in last 6 months? No  LIVING ENVIRONMENT: Lives with: lives with their spouse Lives in: House/apartment Stairs: Yes: Internal: 12 steps; on right going up and External: 4 steps; on right going up Has following equipment at home: 2 single point canes  OCCUPATION: retired  PLOF: Independent, Independent with basic ADLs, Independent with household mobility without device, Independent with community mobility without device, Independent with homemaking with ambulation, Independent with gait, and Independent with transfers  PATIENT GOALS: His goal is to build the strength in his legs enough to where he won't have to stop and rest so frequently and so that he can start walking again  NEXT MD VISIT: as needed  OBJECTIVE:  Note: Objective measures were completed at Evaluation unless otherwise noted.  DIAGNOSTIC FINDINGS:  09/07/23 IMPRESSION: Mild non-compressive disc bulges throughout the lumbar region as outlined above. Mild facet hypertrophy at L4-5. Mild bilateral foraminal narrowing at L4-5, not likely compressive.  PATIENT SURVEYS:  Modified Oswestry 23/50= 46%   COGNITION: Overall cognitive status: Within functional limits for tasks assessed     SENSATION: WFL  MUSCLE LENGTH: Hamstrings: Right 50 deg; Left 50 deg Thomas test: Right pos; Left pos  POSTURE: rounded shoulders, forward head, decreased lumbar lordosis, increased thoracic kyphosis, posterior pelvic tilt, and flexed trunk   PALPATION: na  LUMBAR ROM:   AROM eval  Flexion Fingertips to distal shin but with knees flexed  Extension 50%  Right lateral flexion Fingertips to joint line  Left lateral flexion Fingertips to just above joint line  Right rotation 50%  Left rotation 50%   (Blank rows = not tested)  LOWER EXTREMITY ROM:     All anterior    LOWER EXTREMITY MMT:    Generally 4- to 4/5, hip extensors 3+/5 bilaterally,  hip ER's 4-/5  LUMBAR SPECIAL TESTS:  Straight leg raise test: Negative  FUNCTIONAL TESTS:  5 times sit to stand: 10.42 sec Timed up and go (TUG): 14.30 sec  GAIT: Distance walked: 30 Assistive device utilized: 2 single point canes Level of assistance: SBA Comments: unsteady    TREATMENT DATE: 10/18/23 Nustep x 5 min level 7  (PT present to discuss status) Seated hamstring stretch 3 x 30 sec Seated hip flexor/quad stretch 3 x 30 sec Sit to stand 2 x 10 from mat  Standing hip ABD, ext, march 2x10 ea B - cues for abdominal contraction to stabilize Standing heel raises x 20 Seated mini sit up with 5 lb  2 x 10 Seated trunk extension 2 x 10 with 50# blue power band Kipp People carry unilaterally with 10# R/L length of hallway ea  TREATMENT DATE: 10/11/23 Nustep x 5 min level 6  (PT present to discuss status) Seated hamstring stretch 3 x 30 sec Seated hip flexor/quad stretch 3 x 30 sec Sit to stand 2 x 10 from mat  Standing hip ABD, ext, march 2x10 ea B - cues for abdominal contraction to stabilize Standing heel raises x 20 Dynamic calf stretch at barre x 10 Seated mini sit up with 5 lb  2 x 10 Seated trunk extension 2 x 10 with red steel band  Standing rows with blue tband )x 20 Standing blue shoulder ext with scapular squeeze x 20 Seated horizontal shoulder abduction with green band 2 x 10    TREATMENT DATE: 10/09/23 Nustep x 5 min level 3 LEGS only hurt shoulders last time (PT present to discuss status) Seated hamstring stretch 3 x 30 sec Seated hip ER stretch with strap 1 x 30 sec Seated mini sit up with 5 lb  2 x 10 Seated in reclined position: modified Guernsey Twist 2x 8 with blue plyo ball Seated Hip to shoulder with 5 lb kb x 10 each  Sit to stand 2 x 10 from mat + foam Seated trunk extension 2 x 10 with red steel band  Seated rows with blue tband ) x 20 Standing blue shoulder ext with  scapular squeeze x 20 Seated horizontal shoulder abduction with blue band 2 x 10 Seated hip flexor/quad stretch 3 x 30 sec                                                                                                             PATIENT EDUCATION:  Education details: Educated on posture and risk of natural fusion,  effect of sleeping in a recliner on posture and the difficulty of positioning to do effective core work.  Discussed aquatic therapy as an option if he is not responding well to land PT.  Person educated: Patient Education method: Explanation, Demonstration, and Verbal cues Education comprehension: verbalized understanding, returned demonstration, and verbal cues required  HOME EXERCISE PROGRAM: Access Code: [email or web address] URL: [email or web address] Date: 10/18/2023 Prepared by: Concha Deed  Exercises - Sit to Stand  - 3 x daily - 7 x weekly - 1-2 sets -  10 reps - Standing Shoulder Horizontal Abduction with Resistance  - 1 x daily - 3 x weekly - 1-3 sets - 10 reps - Standing Shoulder Row with Anchored Resistance  - 1 x daily - 3 x weekly - 1-3 sets - 10 reps - Shoulder extension with resistance - Neutral  - 1 x daily - 3 x weekly - 1-3 sets - 10 reps - Seated Shoulder Horizontal Abduction with Resistance - Thumbs Up  - 1 x daily - 3 x weekly - 1-3 sets - 10 reps - Seated Hip Flexor Stretch  - 2 x daily - 7 x weekly - 1 sets - 3 reps - 30 sec hold - Standing Hip Abduction with Counter Support  - 1 x daily - 3 x weekly - 2 sets - 10 reps - Standing Hip Extension with Counter Support  - 1 x daily - 3 x weekly - 2 sets - 10 reps - Standing March with Counter Support  - 1 x daily - 3 x weekly - 2 sets - 10 reps - Heel Raises with Counter Support  - 1 x daily - 3 x weekly - 2 sets - 10 reps  ASSESSMENT:  CLINICAL IMPRESSION:  Patient is progressing with pain control and pain tolerance. He is able to walk 5 min on the TM with minimal pain and also can walk longer if he has  support such as a shopping cart. He reports some R gluteal pain with standing hip ABD, but nothing too intense. HEP updated with standing hip exercises and heel raises. He continues to demonstrate potential for improvement and would benefit from continued skilled therapy to address impairments.    OBJECTIVE IMPAIRMENTS: Abnormal gait, decreased activity tolerance, decreased balance, decreased mobility, difficulty walking, decreased ROM, decreased strength, hypomobility, increased fascial restrictions, increased muscle spasms, impaired flexibility, improper body mechanics, postural dysfunction, and pain.   ACTIVITY LIMITATIONS: carrying, lifting, bending, standing, squatting, sleeping, stairs, transfers, bed mobility, bathing, and dressing  PARTICIPATION LIMITATIONS: meal prep, cleaning, laundry, driving, shopping, community activity, yard work, and church  PERSONAL FACTORS: Fitness, Past/current experiences, Time since onset of injury/illness/exacerbation, and 3+ comorbidities: CAD, GERD, Htn, cervical spinal stenosis are also affecting patient's functional outcome.   REHAB POTENTIAL: Fair due to multiple co-morbidities  CLINICAL DECISION MAKING: Evolving/moderate complexity  EVALUATION COMPLEXITY: Moderate   GOALS: Goals reviewed with patient? Yes  SHORT TERM GOALS: Target date: 10/25/2023  Pain report to be no greater than 4/10  Baseline: Goal status: IN PROGRESS; can walk 5 min with less than 4/10 pain  2.  Patient will be independent with initial HEP  Baseline:  Goal status: MET   LONG TERM GOALS: Target date: 11/22/2023  Patient to report pain no greater than 2/10  Baseline:  Goal status: INITIAL  2.  Patient to be independent with advanced HEP  Baseline:  Goal status: INITIAL  3.  Patient to report 85% improvement in overall symptoms Baseline:  Goal status: INITIAL  4.  Patient to be able to stand or walk for at least 15 min with pain no greater than 2/10  Baseline:   Goal status: INITIAL  5.  ODI to improve to 30% or better Baseline:  Goal status: INITIAL  6.  Functional scores to improve by 2-3 seconds Baseline:  Goal status: INITIAL  PLAN:  PT FREQUENCY: 1-2x/week  PT DURATION: 8 weeks  PLANNED INTERVENTIONS: 97110-Therapeutic exercises, 97530- Therapeutic activity, [id number]- Neuromuscular re-education, 97535- Self Care, 86578- Manual therapy, [id number]- Gait training, [phone]-  Aquatic Therapy, [phone]- Electrical stimulation (unattended), [id number]- Electrical stimulation (manual), [id number]- Ultrasound, [id number]- Traction (mechanical), 13244- Ionotophoresis 4mg /ml Dexamethasone, Patient/Family education, Balance training, Stair training, Taping, Dry Needling, Joint mobilization, Spinal mobilization, Cryotherapy, and Moist heat.  PLAN FOR NEXT SESSION:  Nustep, core strengthening, flexibility  Jinx Mourning, PT  10/18/23 5:01 PM [facility] [address] [place], [place] [address] Phone # [phone] Fax [phone]

## 2023-10-18 ENCOUNTER — Encounter: Payer: Self-pay | Admitting: Physical Therapy

## 2023-10-18 ENCOUNTER — Ambulatory Visit: Admitting: Physical Therapy

## 2023-10-18 DIAGNOSIS — R252 Cramp and spasm: Secondary | ICD-10-CM

## 2023-10-18 DIAGNOSIS — M79604 Pain in right leg: Secondary | ICD-10-CM

## 2023-10-18 DIAGNOSIS — M5416 Radiculopathy, lumbar region: Secondary | ICD-10-CM

## 2023-10-18 DIAGNOSIS — R262 Difficulty in walking, not elsewhere classified: Secondary | ICD-10-CM

## 2023-10-18 DIAGNOSIS — M5459 Other low back pain: Secondary | ICD-10-CM

## 2023-10-18 DIAGNOSIS — R293 Abnormal posture: Secondary | ICD-10-CM

## 2023-10-18 DIAGNOSIS — M6281 Muscle weakness (generalized): Secondary | ICD-10-CM

## 2023-10-24 ENCOUNTER — Ambulatory Visit

## 2023-10-24 ENCOUNTER — Telehealth: Payer: Self-pay

## 2023-10-24 NOTE — Telephone Encounter (Signed)
 Call placed to patient at 4:35 pm to inform him that he had missed his 4:15 pm appt.  Patient apologized.  He thought his appt was tomorrow.  Informed patient of his next appt on Thursday at 4:15 pm.  Patient states he will be here for this appt.

## 2023-10-26 ENCOUNTER — Ambulatory Visit

## 2023-10-26 DIAGNOSIS — M6281 Muscle weakness (generalized): Secondary | ICD-10-CM

## 2023-10-26 DIAGNOSIS — R252 Cramp and spasm: Secondary | ICD-10-CM

## 2023-10-26 DIAGNOSIS — M79604 Pain in right leg: Secondary | ICD-10-CM

## 2023-10-26 DIAGNOSIS — R262 Difficulty in walking, not elsewhere classified: Secondary | ICD-10-CM

## 2023-10-26 DIAGNOSIS — M5459 Other low back pain: Secondary | ICD-10-CM

## 2023-10-26 DIAGNOSIS — R293 Abnormal posture: Secondary | ICD-10-CM

## 2023-10-26 DIAGNOSIS — M5416 Radiculopathy, lumbar region: Secondary | ICD-10-CM

## 2023-10-26 NOTE — Therapy (Signed)
 OUTPATIENT PHYSICAL THERAPY THORACOLUMBAR TREATMENT   Patient Name: Todd Stevens MRN: [id number] DOB:18-Apr-1941, 83 y.o., male Today's Date: 10/26/2023  END OF SESSION:  PT End of Session - 10/26/23 1611     Visit Number 6    Date for PT Re-Evaluation 11/22/23    Authorization Type [employer]    Progress Note Due on Visit 10    PT Start Time 1612    PT Stop Time 1700    PT Time Calculation (min) 48 min    Activity Tolerance Patient tolerated treatment well    Behavior During Therapy WFL for tasks assessed/performed             Past Medical History:  Diagnosis Date   Anemia    none since 2017   Aortic valve sclerosis    CAD (coronary artery disease)    Cervical disc disease    neuropathy   Diverticulosis    Elevated hemidiaphragm    right, 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   Obesity    Pneumonia 01/2010   Hospitalized   Restless leg syndrome    Shortness of breath dyspnea    with exertion    Skin cancer    areas removed   Status post dilation of esophageal narrowing    Past Surgical History:  Procedure Laterality Date   CARDIAC CATHETERIZATION     COLONOSCOPY  08/2001   diverticulosis, internal hemorrhoids   COLONOSCOPY WITH PROPOFOL  N/A 12/07/2015   Procedure: COLONOSCOPY WITH PROPOFOL ;  Surgeon: Lupita FORBES Commander, MD;  Location: [facility];  Service: Endoscopy;  Laterality: N/A;   COLONOSCOPY WITH PROPOFOL  N/A 02/20/2017   Procedure: COLONOSCOPY WITH PROPOFOL ;  Surgeon: Commander Lupita FORBES, MD;  Location: [facility];  Service: Endoscopy;  Laterality: N/A;   CORONARY ANGIOPLASTY WITH  STENT PLACEMENT  2000   stent x 1    ESOPHAGOGASTRODUODENOSCOPY  01/2008   with esophageal dilation - esophageal dysmotility + stenosis, also benign fundic gland gastric polyps   FLEXIBLE SIGMOIDOSCOPY N/A 12/24/2015   Procedure: FLEXIBLE SIGMOIDOSCOPY;  Surgeon: Lupita FORBES Commander, MD;  Location: [facility];  Service: Endoscopy;  Laterality: N/A;   INGUINAL HERNIA REPAIR Right yrs ago   SKIN CANCER EXCISION     TONSILLECTOMY     Patient Active Problem List   Diagnosis Date Noted   Hx of adenomatous colonic polyps    Dyspnea 10/15/2010   Abdominal bloating 03/15/2010   Aortic valve disorder 02/25/2010   Coronary atherosclerosis 02/20/2010   HYPERLIPIDEMIA-MIXED 02/19/2010   Essential hypertension 02/19/2010   ESOPHAGEAL MOTILITY DISORDER 01/28/2008   GERD 01/28/2008    PCP: Yolande Toribio MATSU, MD  REFERRING PROVIDER: Vernetta Lonni GRADE, MD  REFERRING DIAG:  Diagnosis  M54.42,M54.41,G89.29 (ICD-10-CM) - Chronic bilateral low back pain with bilateral sciatica  M79.605 (ICD-10-CM) - Pain in left leg   Rationale for Evaluation and Treatment: Rehabilitation  THERAPY DIAG:  Other low back pain  Muscle weakness (generalized)  Difficulty in walking, not elsewhere classified  Cramp and spasm  Abnormal posture  Pain in right leg  Radiculopathy, lumbar region  ONSET DATE: 09/14/2023  SUBJECTIVE:                                                                                                                                                                                           SUBJECTIVE STATEMENT: Patient states he hasn't been doing much in the way of exercises.   I've been sitting a lot  PERTINENT HISTORY:  Multiple orthopedic and medical issues.   PAIN:  Are you having pain? Yes: NPRS scale: Currently 0 but 2-3/10 at worst in the past few weeks Pain location: low back and hips, buttocks Pain description: aching Aggravating factors: standing or walking for  long periods of time Relieving factors: rest  PRECAUTIONS: Fall  RED FLAGS: None   WEIGHT BEARING RESTRICTIONS: No  FALLS:  Has patient fallen in last 6 months? No  LIVING ENVIRONMENT: Lives with: lives with their spouse Lives in: House/apartment Stairs: Yes: Internal: 12 steps; on right going up and External: 4 steps; on right going up Has following equipment at home: 2 single point canes  OCCUPATION: retired  PLOF: Independent, Independent with basic ADLs, Independent with household mobility without device, Independent with community mobility without device, Independent with homemaking with ambulation, Independent with gait, and Independent with transfers  PATIENT GOALS: His goal is to build the strength in his legs enough to where he won't have to stop and rest so frequently and so that he can start walking again  NEXT MD VISIT: as needed  OBJECTIVE:  Note: Objective measures were completed at Evaluation unless otherwise noted.  DIAGNOSTIC FINDINGS:  09/07/23 IMPRESSION: Mild non-compressive disc bulges throughout the lumbar region as outlined above. Mild facet hypertrophy at L4-5. Mild bilateral foraminal narrowing at L4-5, not likely compressive.  PATIENT SURVEYS:  Modified Oswestry 23/50= 46%   COGNITION: Overall cognitive status: Within functional limits for tasks assessed     SENSATION: WFL  MUSCLE LENGTH: Hamstrings: Right 50 deg; Left 50 deg Thomas test: Right pos; Left pos  POSTURE: rounded shoulders, forward head, decreased lumbar lordosis, increased thoracic kyphosis, posterior pelvic tilt, and flexed trunk   PALPATION: na  LUMBAR ROM:   AROM eval  Flexion Fingertips to distal shin but with knees flexed  Extension 50%  Right lateral flexion Fingertips to joint line  Left lateral flexion Fingertips to just above joint line  Right rotation 50%  Left rotation 50%   (Blank rows = not tested)  LOWER EXTREMITY ROM:     All anterior   LOWER  EXTREMITY MMT:    Generally 4- to 4/5, hip extensors 3+/5 bilaterally, hip ER's 4-/5  LUMBAR SPECIAL  TESTS:  Straight leg raise test: Negative  FUNCTIONAL TESTS:  5 times sit to stand: 10.42 sec Timed up and go (TUG): 14.30 sec  GAIT: Distance walked: 30 Assistive device utilized: 2 single point canes Level of assistance: SBA Comments: unsteady    TREATMENT DATE: 10/26/23 Nustep x 5 min level 7  (PT present to discuss status) Seated hamstring stretch 3 x 30 sec Seated overhead press with 5 lb dumbbell 2 x 10 Seated mini sit ups 2 x 10 with 5 lb dumbbell Seated Guernsey twist with 5 lb 2 x 10 Standing against wall for feedback : scapular retraction and cervical retraction x 10 holding 2-3 seconds (patient did 2 sets) Seated thoracic stretch over purple ball x 10 holding 5 sec each Standing lat pull down 2 x 10 with 30 lbs Sit to stand with fwd press using 5 lb 2 x 10 Facing wall: wing taps 2 x 10  TREATMENT DATE: 10/18/23 Nustep x 5 min level 7  (PT present to discuss status) Seated hamstring stretch 3 x 30 sec Seated hip flexor/quad stretch 3 x 30 sec Sit to stand 2 x 10 from mat  Standing hip ABD, ext, march 2x10 ea B - cues for abdominal contraction to stabilize Standing heel raises x 20 Seated mini sit up with 5 lb  2 x 10 Seated trunk extension 2 x 10 with 50# blue power band Aura carry unilaterally with 10# R/L length of hallway ea  TREATMENT DATE: 10/11/23 Nustep x 5 min level 6  (PT present to discuss status) Seated hamstring stretch 3 x 30 sec Seated hip flexor/quad stretch 3 x 30 sec Sit to stand 2 x 10 from mat  Standing hip ABD, ext, march 2x10 ea B - cues for abdominal contraction to stabilize Standing heel raises x 20 Dynamic calf stretch at barre x 10 Seated mini sit up with 5 lb  2 x 10 Seated trunk extension 2 x 10 with red steel band  Standing rows with blue tband )x 20 Standing blue shoulder ext with scapular squeeze x 20 Seated horizontal shoulder  abduction with green band 2 x 10                               PATIENT EDUCATION:  Education details: Educated on posture and risk of natural fusion,  effect of sleeping in a recliner on posture and the difficulty of positioning to do effective core work.  Discussed aquatic therapy as an option if he is not responding well to land PT.  Person educated: Patient Education method: Explanation, Demonstration, and Verbal cues Education comprehension: verbalized understanding, returned demonstration, and verbal cues required  HOME EXERCISE PROGRAM: Access Code: [email or web address] URL: [email or web address] Date: 10/18/2023 Prepared by: Mliss  Exercises - Sit to Stand  - 3 x daily - 7 x weekly - 1-2 sets - 10 reps - Standing Shoulder Horizontal Abduction with Resistance  - 1 x daily - 3 x weekly - 1-3 sets - 10 reps - Standing Shoulder Row with Anchored Resistance  - 1 x daily - 3 x weekly - 1-3 sets - 10 reps - Shoulder extension with resistance - Neutral  - 1 x daily - 3 x weekly - 1-3 sets - 10 reps - Seated Shoulder Horizontal Abduction with Resistance - Thumbs Up  - 1 x daily - 3 x weekly - 1-3 sets - 10 reps - Seated Hip Flexor Stretch  - 2  x daily - 7 x weekly - 1 sets - 3 reps - 30 sec hold - Standing Hip Abduction with Counter Support  - 1 x daily - 3 x weekly - 2 sets - 10 reps - Standing Hip Extension with Counter Support  - 1 x daily - 3 x weekly - 2 sets - 10 reps - Standing March with Counter Support  - 1 x daily - 3 x weekly - 2 sets - 10 reps - Heel Raises with Counter Support  - 1 x daily - 3 x weekly - 2 sets - 10 reps  ASSESSMENT:  CLINICAL IMPRESSION: Charlena did not do much in the way of exercise since last visit and was feeling restricted and sore in the low back.  We did several stretches and then worked on thoracic strengthening as his posture tends to draw him fwd and further contributes to his lack of lumbar lordosis.  He was able to do all tasks today without lumbar  spine pain.  He continues to demonstrate potential for improvement and would benefit from continued skilled therapy to address impairments.    OBJECTIVE IMPAIRMENTS: Abnormal gait, decreased activity tolerance, decreased balance, decreased mobility, difficulty walking, decreased ROM, decreased strength, hypomobility, increased fascial restrictions, increased muscle spasms, impaired flexibility, improper body mechanics, postural dysfunction, and pain.   ACTIVITY LIMITATIONS: carrying, lifting, bending, standing, squatting, sleeping, stairs, transfers, bed mobility, bathing, and dressing  PARTICIPATION LIMITATIONS: meal prep, cleaning, laundry, driving, shopping, community activity, yard work, and church  PERSONAL FACTORS: Fitness, Past/current experiences, Time since onset of injury/illness/exacerbation, and 3+ comorbidities: CAD, GERD, Htn, cervical spinal stenosis are also affecting patient's functional outcome.   REHAB POTENTIAL: Fair due to multiple co-morbidities  CLINICAL DECISION MAKING: Evolving/moderate complexity  EVALUATION COMPLEXITY: Moderate   GOALS: Goals reviewed with patient? Yes  SHORT TERM GOALS: Target date: 10/25/2023  Pain report to be no greater than 4/10  Baseline: Goal status: IN PROGRESS; can walk 5 min with less than 4/10 pain  2.  Patient will be independent with initial HEP  Baseline:  Goal status: MET   LONG TERM GOALS: Target date: 11/22/2023  Patient to report pain no greater than 2/10  Baseline:  Goal status: INITIAL  2.  Patient to be independent with advanced HEP  Baseline:  Goal status: INITIAL  3.  Patient to report 85% improvement in overall symptoms Baseline:  Goal status: INITIAL  4.  Patient to be able to stand or walk for at least 15 min with pain no greater than 2/10  Baseline:  Goal status: INITIAL  5.  ODI to improve to 30% or better Baseline:  Goal status: INITIAL  6.  Functional scores to improve by 2-3  seconds Baseline:  Goal status: INITIAL  PLAN:  PT FREQUENCY: 1-2x/week  PT DURATION: 8 weeks  PLANNED INTERVENTIONS: 97110-Therapeutic exercises, 97530- Therapeutic activity, [id number]- Neuromuscular re-education, 97535- Self Care, [id number]- Manual therapy, [phone]- Gait training, [phone]- Aquatic Therapy, [phone]- Electrical stimulation (unattended), [phone]- Electrical stimulation (manual), [id number]- Ultrasound, [id number]- Traction (mechanical), [id number]- Ionotophoresis 4mg /ml Dexamethasone, Patient/Family education, Balance training, Stair training, Taping, Dry Needling, Joint mobilization, Spinal mobilization, Cryotherapy, and Moist heat.  PLAN FOR NEXT SESSION:  Nustep, core strengthening, flexibility, postural and thoracic extension strengthening.   Delon B. Laney Louderback, PT 10/26/23 5:09 PM [facility] [address], [address] [place], [place] [address] Phone # [phone] Fax [phone]

## 2023-10-31 ENCOUNTER — Telehealth: Payer: Self-pay

## 2023-10-31 ENCOUNTER — Ambulatory Visit: Attending: Orthopaedic Surgery

## 2023-10-31 DIAGNOSIS — M79604 Pain in right leg: Secondary | ICD-10-CM | POA: Insufficient documentation

## 2023-10-31 DIAGNOSIS — R293 Abnormal posture: Secondary | ICD-10-CM | POA: Insufficient documentation

## 2023-10-31 DIAGNOSIS — R262 Difficulty in walking, not elsewhere classified: Secondary | ICD-10-CM | POA: Insufficient documentation

## 2023-10-31 DIAGNOSIS — M6281 Muscle weakness (generalized): Secondary | ICD-10-CM | POA: Insufficient documentation

## 2023-10-31 DIAGNOSIS — M5416 Radiculopathy, lumbar region: Secondary | ICD-10-CM | POA: Insufficient documentation

## 2023-10-31 DIAGNOSIS — M5459 Other low back pain: Secondary | ICD-10-CM | POA: Insufficient documentation

## 2023-10-31 DIAGNOSIS — R252 Cramp and spasm: Secondary | ICD-10-CM | POA: Insufficient documentation

## 2023-10-31 NOTE — Telephone Encounter (Signed)
 Call placed to patient to inform of missed visit.  He explains he forgot again and apologized.  He states there is a lot going on at his home and he's having a hard time keeping up with everything.  He recalls that he has an appt  on Thursday at 4:15  and confirms he will be here.

## 2023-11-02 ENCOUNTER — Ambulatory Visit

## 2023-11-02 DIAGNOSIS — R252 Cramp and spasm: Secondary | ICD-10-CM

## 2023-11-02 DIAGNOSIS — M79604 Pain in right leg: Secondary | ICD-10-CM | POA: Diagnosis present

## 2023-11-02 DIAGNOSIS — R262 Difficulty in walking, not elsewhere classified: Secondary | ICD-10-CM

## 2023-11-02 DIAGNOSIS — M5459 Other low back pain: Secondary | ICD-10-CM | POA: Diagnosis present

## 2023-11-02 DIAGNOSIS — R293 Abnormal posture: Secondary | ICD-10-CM

## 2023-11-02 DIAGNOSIS — M6281 Muscle weakness (generalized): Secondary | ICD-10-CM | POA: Diagnosis present

## 2023-11-02 DIAGNOSIS — M5416 Radiculopathy, lumbar region: Secondary | ICD-10-CM

## 2023-11-02 NOTE — Therapy (Signed)
 OUTPATIENT PHYSICAL THERAPY THORACOLUMBAR TREATMENT   Patient Name: Todd Stevens MRN: [id number] DOB:24-May-1940, 83 y.o., male Today's Date: 11/02/2023  END OF SESSION:  PT End of Session - 11/02/23 1537     Visit Number 7    Date for PT Re-Evaluation 11/22/23    Authorization Type [employer]    Progress Note Due on Visit 10    PT Start Time 1532    PT Stop Time 1614    PT Time Calculation (min) 42 min    Activity Tolerance Patient tolerated treatment well    Behavior During Therapy WFL for tasks assessed/performed             Past Medical History:  Diagnosis Date   Anemia    none since 2017   Aortic valve sclerosis    CAD (coronary artery disease)    Cervical disc disease    neuropathy   Diverticulosis    Elevated hemidiaphragm    right, 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   Obesity    Pneumonia 01/2010   Hospitalized   Restless leg syndrome    Shortness of breath dyspnea    with exertion    Skin cancer    areas removed   Status post dilation of esophageal narrowing    Past Surgical History:  Procedure Laterality Date   CARDIAC CATHETERIZATION     COLONOSCOPY  08/2001   diverticulosis, internal hemorrhoids   COLONOSCOPY WITH PROPOFOL  N/A 12/07/2015   Procedure: COLONOSCOPY WITH PROPOFOL ;  Surgeon: Lupita FORBES Commander, MD;  Location: [facility];  Service: Endoscopy;  Laterality: N/A;   COLONOSCOPY WITH PROPOFOL  N/A 02/20/2017   Procedure: COLONOSCOPY WITH PROPOFOL ;  Surgeon: Commander Lupita FORBES, MD;  Location: [facility];  Service: Endoscopy;  Laterality: N/A;   CORONARY ANGIOPLASTY WITH  STENT PLACEMENT  2000   stent x 1    ESOPHAGOGASTRODUODENOSCOPY  01/2008   with esophageal dilation - esophageal dysmotility + stenosis, also benign fundic gland gastric polyps   FLEXIBLE SIGMOIDOSCOPY N/A 12/24/2015   Procedure: FLEXIBLE SIGMOIDOSCOPY;  Surgeon: Lupita FORBES Commander, MD;  Location: [facility];  Service: Endoscopy;  Laterality: N/A;   INGUINAL HERNIA REPAIR Right yrs ago   SKIN CANCER EXCISION     TONSILLECTOMY     Patient Active Problem List   Diagnosis Date Noted   Hx of adenomatous colonic polyps    Dyspnea 10/15/2010   Abdominal bloating 03/15/2010   Aortic valve disorder 02/25/2010   Coronary atherosclerosis 02/20/2010   HYPERLIPIDEMIA-MIXED 02/19/2010   Essential hypertension 02/19/2010   ESOPHAGEAL MOTILITY DISORDER 01/28/2008   GERD 01/28/2008    PCP: Yolande Toribio MATSU, MD  REFERRING PROVIDER: Vernetta Lonni GRADE, MD  REFERRING DIAG:  Diagnosis  M54.42,M54.41,G89.29 (ICD-10-CM) - Chronic bilateral low back pain with bilateral sciatica  M79.605 (ICD-10-CM) - Pain in left leg   Rationale for Evaluation and Treatment: Rehabilitation  THERAPY DIAG:  Other low back pain  Pain in right leg  Radiculopathy, lumbar region  Muscle weakness (generalized)  Difficulty in walking, not elsewhere classified  Cramp and  spasm  Abnormal posture  ONSET DATE: 09/14/2023  SUBJECTIVE:                                                                                                                                                                                           SUBJECTIVE STATEMENT: Patient states he is just tired. No pain.  PERTINENT HISTORY:  Multiple orthopedic and medical issues.   PAIN:  Are you having pain? Yes: NPRS scale: Currently 0 but 2-3/10 at worst in the past few weeks Pain location: low back and hips, buttocks Pain description: aching Aggravating factors: standing or walking for long periods of time Relieving factors:  rest  PRECAUTIONS: Fall  RED FLAGS: None   WEIGHT BEARING RESTRICTIONS: No  FALLS:  Has patient fallen in last 6 months? No  LIVING ENVIRONMENT: Lives with: lives with their spouse Lives in: House/apartment Stairs: Yes: Internal: 12 steps; on right going up and External: 4 steps; on right going up Has following equipment at home: 2 single point canes  OCCUPATION: retired  PLOF: Independent, Independent with basic ADLs, Independent with household mobility without device, Independent with community mobility without device, Independent with homemaking with ambulation, Independent with gait, and Independent with transfers  PATIENT GOALS: His goal is to build the strength in his legs enough to where he won't have to stop and rest so frequently and so that he can start walking again  NEXT MD VISIT: as needed  OBJECTIVE:  Note: Objective measures were completed at Evaluation unless otherwise noted.  DIAGNOSTIC FINDINGS:  09/07/23 IMPRESSION: Mild non-compressive disc bulges throughout the lumbar region as outlined above. Mild facet hypertrophy at L4-5. Mild bilateral foraminal narrowing at L4-5, not likely compressive.  PATIENT SURVEYS:  Modified Oswestry 23/50= 46%   COGNITION: Overall cognitive status: Within functional limits for tasks assessed     SENSATION: WFL  MUSCLE LENGTH: Hamstrings: Right 50 deg; Left 50 deg Thomas test: Right pos; Left pos  POSTURE: rounded shoulders, forward head, decreased lumbar lordosis, increased thoracic kyphosis, posterior pelvic tilt, and flexed trunk   PALPATION: na  LUMBAR ROM:   AROM eval  Flexion Fingertips to distal shin but with knees flexed  Extension 50%  Right lateral flexion Fingertips to joint line  Left lateral flexion Fingertips to just above joint line  Right rotation 50%  Left rotation 50%   (Blank rows = not tested)  LOWER EXTREMITY ROM:     All anterior   LOWER EXTREMITY MMT:    Generally 4- to 4/5,  hip extensors 3+/5 bilaterally, hip ER's 4-/5  LUMBAR SPECIAL TESTS:  Straight leg raise test: Negative  FUNCTIONAL TESTS:  5 times sit to stand: 10.42 sec Timed up and go (TUG): 14.30 sec  GAIT: Distance walked: 30 Assistive device utilized: 2 single point canes Level of assistance: SBA Comments: unsteady    TREATMENT DATE: 11/02/23 Nustep x 5 min level 7  (PT present to discuss status) Seated hamstring stretch 3 x 30 sec Standing quad/hip flexor stretch 3 x 30 sec Seated piriformis stretch 3 x 30 sec Seated fwd press with 5 lb dumbbell 2 x 10 Seated overhead press with 5 lb dumbbell 2 x 10 Seated mini sit ups 2 x 10 with 5 lb dumbbell Seated Guernsey twist with 5 lb 2 x 10 Seated shoulder to hip with 5 lb dumbbell x 10 each side Seated thoracic stretch over purple ball x 10 holding 5 sec each (patient requested we not do this one as he has a painful neck vertebrae)   TREATMENT DATE: 10/26/23 Nustep x 5 min level 7  (PT present to discuss status) Seated hamstring stretch 3 x 30 sec Seated overhead press with 5 lb dumbbell 2 x 10 Seated mini sit ups 2 x 10 with 5 lb dumbbell Seated Guernsey twist with 5 lb 2 x 10 Standing against wall for feedback : scapular retraction and cervical retraction x 10 holding 2-3 seconds (patient did 2 sets) Seated thoracic stretch over purple ball x 10 holding 5 sec each Standing lat pull down 2 x 10 with 30 lbs Sit to stand with fwd press using 5 lb 2 x 10 Facing wall: wing taps 2 x 10  TREATMENT DATE: 10/18/23 Nustep x 5 min level 7  (PT present to discuss status) Seated hamstring stretch 3 x 30 sec Seated hip flexor/quad stretch 3 x 30 sec Sit to stand 2 x 10 from mat  Standing hip ABD, ext, march 2x10 ea B - cues for abdominal contraction to stabilize Standing heel raises x 20 Seated mini sit up with 5 lb  2 x 10 Seated trunk extension 2 x 10 with 50# blue power band Farmer carry unilaterally with 10# R/L length of hallway ea  PATIENT  EDUCATION:  Education details: Educated on posture and risk of natural fusion,  effect of sleeping in a recliner on posture and the difficulty of positioning to do effective core work.  Discussed aquatic therapy as an option if he is not responding well to land PT.  Person educated: Patient Education method: Explanation, Demonstration, and Verbal cues Education comprehension: verbalized understanding, returned demonstration, and verbal cues required  HOME EXERCISE PROGRAM: Access Code: [email or web address] URL: [email or web address] Date: 10/18/2023 Prepared by: Mliss  Exercises - Sit to Stand  - 3 x daily - 7 x weekly - 1-2 sets - 10 reps - Standing Shoulder Horizontal Abduction with Resistance  - 1 x daily - 3 x weekly - 1-3 sets - 10 reps - Standing Shoulder Row with Anchored Resistance  - 1 x daily - 3 x weekly - 1-3 sets - 10 reps - Shoulder extension with resistance - Neutral  - 1 x daily - 3 x weekly - 1-3 sets - 10 reps - Seated Shoulder Horizontal Abduction with Resistance - Thumbs Up  - 1 x daily - 3 x weekly - 1-3 sets - 10 reps - Seated Hip Flexor Stretch  - 2 x daily - 7 x weekly - 1 sets - 3 reps - 30 sec hold - Standing Hip Abduction with Counter Support  - 1 x daily - 3 x weekly - 2 sets - 10 reps -  Standing Hip Extension with Counter Support  - 1 x daily - 3 x weekly - 2 sets - 10 reps - Standing March with Counter Support  - 1 x daily - 3 x weekly - 2 sets - 10 reps - Heel Raises with Counter Support  - 1 x daily - 3 x weekly - 2 sets - 10 reps  ASSESSMENT:  CLINICAL IMPRESSION: Charlena is progressing slowly as he has forgotten 2 of his appointments in the past 2 weeks.  He has been able to tolerate a fairly active program without need for his oxygen.  In fact, he no longer brings his oxygen in with him. O2 sats remain normal throughout treatment.    He continues to demonstrate potential for improvement and would benefit from continued skilled therapy to address  impairments.    OBJECTIVE IMPAIRMENTS: Abnormal gait, decreased activity tolerance, decreased balance, decreased mobility, difficulty walking, decreased ROM, decreased strength, hypomobility, increased fascial restrictions, increased muscle spasms, impaired flexibility, improper body mechanics, postural dysfunction, and pain.   ACTIVITY LIMITATIONS: carrying, lifting, bending, standing, squatting, sleeping, stairs, transfers, bed mobility, bathing, and dressing  PARTICIPATION LIMITATIONS: meal prep, cleaning, laundry, driving, shopping, community activity, yard work, and church  PERSONAL FACTORS: Fitness, Past/current experiences, Time since onset of injury/illness/exacerbation, and 3+ comorbidities: CAD, GERD, Htn, cervical spinal stenosis are also affecting patient's functional outcome.   REHAB POTENTIAL: Fair due to multiple co-morbidities  CLINICAL DECISION MAKING: Evolving/moderate complexity  EVALUATION COMPLEXITY: Moderate   GOALS: Goals reviewed with patient? Yes  SHORT TERM GOALS: Target date: 10/25/2023  Pain report to be no greater than 4/10  Baseline: Goal status: IN PROGRESS; can walk 5 min with less than 4/10 pain  2.  Patient will be independent with initial HEP  Baseline:  Goal status: MET   LONG TERM GOALS: Target date: 11/22/2023  Patient to report pain no greater than 2/10  Baseline:  Goal status: MET 11/02/23  2.  Patient to be independent with advanced HEP  Baseline:  Goal status: In Progress  3.  Patient to report 85% improvement in overall symptoms Baseline:  Goal status: INITIAL  4.  Patient to be able to stand or walk for at least 15 min with pain no greater than 2/10  Baseline:  Goal status: INITIAL  5.  ODI to improve to 30% or better Baseline:  Goal status: INITIAL  6.  Functional scores to improve by 2-3 seconds Baseline:  Goal status: INITIAL  PLAN:  PT FREQUENCY: 1-2x/week  PT DURATION: 8 weeks  PLANNED INTERVENTIONS:  97110-Therapeutic exercises, 97530- Therapeutic activity, [id number]- Neuromuscular re-education, 97535- Self Care, [id number]- Manual therapy, [phone]- Gait training, [phone]- Aquatic Therapy, [phone]- Electrical stimulation (unattended), [phone]- Electrical stimulation (manual), [id number]- Ultrasound, [id number]- Traction (mechanical), [id number]- Ionotophoresis 4mg /ml Dexamethasone, Patient/Family education, Balance training, Stair training, Taping, Dry Needling, Joint mobilization, Spinal mobilization, Cryotherapy, and Moist heat.  PLAN FOR NEXT SESSION:  Nustep, core strengthening, flexibility, postural and thoracic extension strengthening.   Delon B. Cienna Dumais, PT 11/02/23 4:20 PM [facility] [address] [place], [place] [address] Phone # [phone] Fax [phone]

## 2023-11-07 ENCOUNTER — Encounter: Admitting: Rehabilitation

## 2023-11-07 ENCOUNTER — Ambulatory Visit: Admitting: Physical Therapy

## 2023-11-07 ENCOUNTER — Encounter: Payer: Self-pay | Admitting: Rehabilitation

## 2023-11-07 ENCOUNTER — Ambulatory Visit: Admitting: Rehabilitation

## 2023-11-07 DIAGNOSIS — M5416 Radiculopathy, lumbar region: Secondary | ICD-10-CM

## 2023-11-07 DIAGNOSIS — R252 Cramp and spasm: Secondary | ICD-10-CM

## 2023-11-07 DIAGNOSIS — M5459 Other low back pain: Secondary | ICD-10-CM | POA: Diagnosis not present

## 2023-11-07 DIAGNOSIS — M6281 Muscle weakness (generalized): Secondary | ICD-10-CM

## 2023-11-07 DIAGNOSIS — R262 Difficulty in walking, not elsewhere classified: Secondary | ICD-10-CM

## 2023-11-07 DIAGNOSIS — M79604 Pain in right leg: Secondary | ICD-10-CM

## 2023-11-07 DIAGNOSIS — R293 Abnormal posture: Secondary | ICD-10-CM

## 2023-11-07 NOTE — Therapy (Signed)
 OUTPATIENT PHYSICAL THERAPY THORACOLUMBAR TREATMENT   Patient Name: Todd Stevens MRN: [id number] DOB:03-19-1941, 83 y.o., male Today's Date: 11/07/2023  END OF SESSION:  PT End of Session - 11/07/23 1228     Visit Number 8    Date for PT Re-Evaluation 11/22/23    PT Start Time 1230    PT Stop Time 1300    PT Time Calculation (min) 30 min    Activity Tolerance Patient tolerated treatment well    Behavior During Therapy [facility] for tasks assessed/performed             Past Medical History:  Diagnosis Date   Anemia    none since 2017   Aortic valve sclerosis    CAD (coronary artery disease)    Cervical disc disease    neuropathy   Diverticulosis    Elevated hemidiaphragm    right, 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   Obesity    Pneumonia 01/2010   Hospitalized   Restless leg syndrome    Shortness of breath dyspnea    with exertion    Skin cancer    areas removed   Status post dilation of esophageal narrowing    Past Surgical History:  Procedure Laterality Date   CARDIAC CATHETERIZATION     COLONOSCOPY  08/2001   diverticulosis, internal hemorrhoids   COLONOSCOPY WITH PROPOFOL  N/A 12/07/2015   Procedure: COLONOSCOPY WITH PROPOFOL ;  Surgeon: Lupita FORBES Commander, MD;  Location: [facility];  Service: Endoscopy;  Laterality: N/A;   COLONOSCOPY WITH PROPOFOL  N/A 02/20/2017   Procedure: COLONOSCOPY WITH PROPOFOL ;  Surgeon: Commander Lupita FORBES, MD;  Location: [facility];  Service: Endoscopy;  Laterality: N/A;   CORONARY ANGIOPLASTY WITH STENT PLACEMENT  2000   stent x 1    ESOPHAGOGASTRODUODENOSCOPY   01/2008   with esophageal dilation - esophageal dysmotility + stenosis, also benign fundic gland gastric polyps   FLEXIBLE SIGMOIDOSCOPY N/A 12/24/2015   Procedure: FLEXIBLE SIGMOIDOSCOPY;  Surgeon: Lupita FORBES Commander, MD;  Location: [facility];  Service: Endoscopy;  Laterality: N/A;   INGUINAL HERNIA REPAIR Right yrs ago   SKIN CANCER EXCISION     TONSILLECTOMY     Patient Active Problem List   Diagnosis Date Noted   Hx of adenomatous colonic polyps    Dyspnea 10/15/2010   Abdominal bloating 03/15/2010   Aortic valve disorder 02/25/2010   Coronary atherosclerosis 02/20/2010   HYPERLIPIDEMIA-MIXED 02/19/2010   Essential hypertension 02/19/2010   ESOPHAGEAL MOTILITY DISORDER 01/28/2008   GERD 01/28/2008    PCP: Yolande Toribio MATSU, MD  REFERRING PROVIDER: Vernetta Lonni GRADE, MD  REFERRING DIAG:  Diagnosis  M54.42,M54.41,G89.29 (ICD-10-CM) - Chronic bilateral low back pain with bilateral sciatica  M79.605 (ICD-10-CM) - Pain in left leg   Rationale for Evaluation and Treatment: Rehabilitation  THERAPY DIAG:  Other low back pain  Pain in right leg  Radiculopathy, lumbar region  Muscle weakness (generalized)  Difficulty in walking, not elsewhere classified  Cramp and spasm  Abnormal posture  ONSET DATE: 09/14/2023  SUBJECTIVE:  SUBJECTIVE STATEMENT: I am feeling okay.  I felt okay after last time.    PERTINENT HISTORY:  Multiple orthopedic and medical issues.   PAIN:  Are you having pain? Yes: NPRS scale: Currently 0 but 2-3/10 at worst in the past few weeks Pain location: low back and hips, buttocks Pain description: aching Aggravating factors: standing or walking for long periods of time Relieving factors: rest  PRECAUTIONS: Fall  RED FLAGS: None   WEIGHT BEARING  RESTRICTIONS: No  FALLS:  Has patient fallen in last 6 months? No  LIVING ENVIRONMENT: Lives with: lives with their spouse Lives in: House/apartment Stairs: Yes: Internal: 12 steps; on right going up and External: 4 steps; on right going up Has following equipment at home: 2 single point canes  OCCUPATION: retired  PLOF: Independent, Independent with basic ADLs, Independent with household mobility without device, Independent with community mobility without device, Independent with homemaking with ambulation, Independent with gait, and Independent with transfers  PATIENT GOALS: His goal is to build the strength in his legs enough to where he won't have to stop and rest so frequently and so that he can start walking again  NEXT MD VISIT: as needed  OBJECTIVE:  Note: Objective measures were completed at Evaluation unless otherwise noted.  DIAGNOSTIC FINDINGS:  09/07/23 IMPRESSION: Mild non-compressive disc bulges throughout the lumbar region as outlined above. Mild facet hypertrophy at L4-5. Mild bilateral foraminal narrowing at L4-5, not likely compressive.  PATIENT SURVEYS:  Modified Oswestry 23/50= 46%   COGNITION: Overall cognitive status: Within functional limits for tasks assessed     SENSATION: WFL  MUSCLE LENGTH: Hamstrings: Right 50 deg; Left 50 deg Thomas test: Right pos; Left pos  POSTURE: rounded shoulders, forward head, decreased lumbar lordosis, increased thoracic kyphosis, posterior pelvic tilt, and flexed trunk   PALPATION: na  LUMBAR ROM:   AROM eval  Flexion Fingertips to distal shin but with knees flexed  Extension 50%  Right lateral flexion Fingertips to joint line  Left lateral flexion Fingertips to just above joint line  Right rotation 50%  Left rotation 50%   (Blank rows = not tested)  LOWER EXTREMITY ROM:     All anterior   LOWER EXTREMITY MMT:    Generally 4- to 4/5, hip extensors 3+/5 bilaterally, hip ER's 4-/5  LUMBAR SPECIAL  TESTS:  Straight leg raise test: Negative  FUNCTIONAL TESTS:  5 times sit to stand: 10.42 sec Timed up and go (TUG): 14.30 sec  GAIT: Distance walked: 30 Assistive device utilized: 2 single point canes Level of assistance: SBA Comments: unsteady    TREATMENT DATE:  11/07/23 Nustep x 8 min level 5 (legs only per pt request)  (PT present to discuss status) Standing hip ABD, ext, march 2x10 ea B - cues for abdominal contraction to stabilize Seated hamstring stretch 2 x 30 sec Seated piriformis stretch 2 x 30 sec Seated fwd press with 5 lb dumbbell 2 x 10 Seated overhead press with 5 lb dumbbell x 10 Seated [place] twist with 5 lb 2 x 10 Sit to stand 2 x 10 Standing hip ABD, ext, march 2x5 ea B - cues for abdominal contraction to stabilize Heel raises x 10  11/02/23 Nustep x 5 min level 7  (PT present to discuss status) Seated hamstring stretch 3 x 30 sec Standing quad/hip flexor stretch 3 x 30 sec Seated piriformis stretch 3 x 30 sec Seated fwd press with 5 lb dumbbell 2 x 10 Seated overhead press with 5 lb  dumbbell 2 x 10 Seated mini sit ups 2 x 10 with 5 lb dumbbell Seated [place] twist with 5 lb 2 x 10 Seated shoulder to hip with 5 lb dumbbell x 10 each side Seated thoracic stretch over purple ball x 10 holding 5 sec each (patient requested we not do this one as he has a painful neck vertebrae)  TREATMENT DATE: 10/26/23 Nustep x 5 min level 7  (PT present to discuss status) Seated hamstring stretch 3 x 30 sec Seated overhead press with 5 lb dumbbell 2 x 10 Seated mini sit ups 2 x 10 with 5 lb dumbbell Seated Guern[place] twist with 5 lb 2 x 10 Standing against wall for feedback : scapular retraction and cervical retraction x 10 holding 2-3 seconds (patient did 2 sets) Seated thoracic stretch over purple ball x 10 holding 5 sec each Standing lat pull down 2 x 10 with 30 lbs Sit to stand with fwd press using 5 lb 2 x 10 Facing wall: wing taps 2 x 10  TREATMENT DATE:  10/18/23 Nustep x 5 min level 7  (PT present to discuss status) Seated hamstring stretch 3 x 30 sec Seated hip flexor/quad stretch 3 x 30 sec Sit to stand 2 x 10 from mat  Standing hip ABD, ext, march 2x10 ea B - cues for abdominal contraction to stabilize Standing heel raises x 20 Seated mini sit up with 5 lb  2 x 10 Seated trunk extension 2 x 10 with 50# blue power band Farmer carry unilaterally with 10# R/L length of hallway ea  PATIENT EDUCATION:  Education details: Educated on posture and risk of natural fusion,  effect of sleeping in a recliner on posture and the difficulty of positioning to do effective core work.  Discussed aquatic therapy as an option if he is not responding well to land PT.  Person educated: Patient Education method: Explanation, Demonstration, and Verbal cues Education comprehension: verbalized understanding, returned demonstration, and verbal cues required  HOME EXERCISE PROGRAM: Access Code: [email or web address] URL: [email or web address] Date: 10/18/2023 Prepared by: Mliss  Exercises - Sit to Stand  - 3 x daily - 7 x weekly - 1-2 sets - 10 reps - Standing Shoulder Horizontal Abduction with Resistance  - 1 x daily - 3 x weekly - 1-3 sets - 10 reps - Standing Shoulder Row with Anchored Resistance  - 1 x daily - 3 x weekly - 1-3 sets - 10 reps - Shoulder extension with resistance - Neutral  - 1 x daily - 3 x weekly - 1-3 sets - 10 reps - Seated Shoulder Horizontal Abduction with Resistance - Thumbs Up  - 1 x daily - 3 x weekly - 1-3 sets - 10 reps - Seated Hip Flexor Stretch  - 2 x daily - 7 x weekly - 1 sets - 3 reps - 30 sec hold - Standing Hip Abduction with Counter Support  - 1 x daily - 3 x weekly - 2 sets - 10 reps - Standing Hip Extension with Counter Support  - 1 x daily - 3 x weekly - 2 sets - 10 reps - Standing March with Counter Support  - 1 x daily - 3 x weekly - 2 sets - 10 reps - Heel Raises with Counter Support  - 1 x daily - 3 x weekly - 2  sets - 10 reps  ASSESSMENT:  CLINICAL IMPRESSION: Pt arrived at wrong time but was able to be seen by a different therapist.  He tolerated all  well without significant changes to current routine.  Did report he likes to do more for his legs overall.   OBJECTIVE IMPAIRMENTS: Abnormal gait, decreased activity tolerance, decreased balance, decreased mobility, difficulty walking, decreased ROM, decreased strength, hypomobility, increased fascial restrictions, increased muscle spasms, impaired flexibility, improper body mechanics, postural dysfunction, and pain.   ACTIVITY LIMITATIONS: carrying, lifting, bending, standing, squatting, sleeping, stairs, transfers, bed mobility, bathing, and dressing  PARTICIPATION LIMITATIONS: meal prep, cleaning, laundry, driving, shopping, community activity, yard work, and church  PERSONAL FACTORS: Fitness, Past/current experiences, Time since onset of injury/illness/exacerbation, and 3+ comorbidities: CAD, GERD, Htn, cervical spinal stenosis are also affecting patient's functional outcome.   REHAB POTENTIAL: Fair due to multiple co-morbidities  CLINICAL DECISION MAKING: Evolving/moderate complexity  EVALUATION COMPLEXITY: Moderate   GOALS: Goals reviewed with patient? Yes  SHORT TERM GOALS: Target date: 10/25/2023  Pain report to be no greater than 4/10  Baseline: Goal status: IN PROGRESS; can walk 5 min with less than 4/10 pain  2.  Patient will be independent with initial HEP  Baseline:  Goal status: MET   LONG TERM GOALS: Target date: 11/22/2023  Patient to report pain no greater than 2/10  Baseline:  Goal status: MET 11/02/23  2.  Patient to be independent with advanced HEP  Baseline:  Goal status: In Progress  3.  Patient to report 85% improvement in overall symptoms Baseline:  Goal status: INITIAL  4.  Patient to be able to stand or walk for at least 15 min with pain no greater than 2/10  Baseline:  Goal status: INITIAL  5.  ODI  to improve to 30% or better Baseline:  Goal status: INITIAL  6.  Functional scores to improve by 2-3 seconds Baseline:  Goal status: INITIAL  PLAN:  PT FREQUENCY: 1-2x/week  PT DURATION: 8 weeks  PLANNED INTERVENTIONS: 97110-Therapeutic exercises, 97530- Therapeutic activity, [id number]- Neuromuscular re-education, 97535- Self Care, [id number]- Manual therapy, [phone]- Gait training, [phone]- Aquatic Therapy, [phone]- Electrical stimulation (unattended), [phone]- Electrical stimulation (manual), [id number]- Ultrasound, [id number]- Traction (mechanical), [id number]- Ionotophoresis 4mg /ml Dexamethasone, Patient/Family education, Balance training, Stair training, Taping, Dry Needling, Joint mobilization, Spinal mobilization, Cryotherapy, and Moist heat.  PLAN FOR NEXT SESSION:  Nustep, core strengthening, flexibility, postural and thoracic extension strengthening.   Saddie Raw, PT  11/07/23 3:07 PM [facility] [address] [place], [place] [address] Phone # [phone] Fax [phone]

## 2023-11-09 NOTE — Therapy (Signed)
 OUTPATIENT PHYSICAL THERAPY THORACOLUMBAR TREATMENT   Patient Name: Todd Stevens MRN: [id number] DOB:April 06, 1941, 83 y.o., male Today's Date: 11/10/2023  END OF SESSION:  PT End of Session - 11/10/23 1026     Visit Number 9    Date for PT Re-Evaluation 11/22/23    Authorization Type [employer]    Progress Note Due on Visit 10    PT Start Time 1018    PT Stop Time 1100    PT Time Calculation (min) 42 min    Activity Tolerance Patient tolerated treatment well    Behavior During Therapy WFL for tasks assessed/performed              Past Medical History:  Diagnosis Date   Anemia    none since 2017   Aortic valve sclerosis    CAD (coronary artery disease)    Cervical disc disease    neuropathy   Diverticulosis    Elevated hemidiaphragm    right, 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   Obesity    Pneumonia 01/2010   Hospitalized   Restless leg syndrome    Shortness of breath dyspnea    with exertion    Skin cancer    areas removed   Status post dilation of esophageal narrowing    Past Surgical History:  Procedure Laterality Date   CARDIAC CATHETERIZATION     COLONOSCOPY  08/2001   diverticulosis, internal hemorrhoids   COLONOSCOPY WITH PROPOFOL  N/A 12/07/2015   Procedure: COLONOSCOPY WITH PROPOFOL ;  Surgeon: Lupita FORBES Commander, MD;  Location: [facility];  Service: Endoscopy;  Laterality: N/A;   COLONOSCOPY WITH PROPOFOL  N/A 02/20/2017   Procedure: COLONOSCOPY WITH PROPOFOL ;  Surgeon: Commander Lupita FORBES, MD;  Location: [facility];  Service: Endoscopy;  Laterality: N/A;   CORONARY ANGIOPLASTY WITH  STENT PLACEMENT  2000   stent x 1    ESOPHAGOGASTRODUODENOSCOPY  01/2008   with esophageal dilation - esophageal dysmotility + stenosis, also benign fundic gland gastric polyps   FLEXIBLE SIGMOIDOSCOPY N/A 12/24/2015   Procedure: FLEXIBLE SIGMOIDOSCOPY;  Surgeon: Lupita FORBES Commander, MD;  Location: [facility];  Service: Endoscopy;  Laterality: N/A;   INGUINAL HERNIA REPAIR Right yrs ago   SKIN CANCER EXCISION     TONSILLECTOMY     Patient Active Problem List   Diagnosis Date Noted   Hx of adenomatous colonic polyps    Dyspnea 10/15/2010   Abdominal bloating 03/15/2010   Aortic valve disorder 02/25/2010   Coronary atherosclerosis 02/20/2010   HYPERLIPIDEMIA-MIXED 02/19/2010   Essential hypertension 02/19/2010   ESOPHAGEAL MOTILITY DISORDER 01/28/2008   GERD 01/28/2008    PCP: Yolande Toribio MATSU, MD  REFERRING PROVIDER: Vernetta Lonni GRADE, MD  REFERRING DIAG:  Diagnosis  M54.42,M54.41,G89.29 (ICD-10-CM) - Chronic bilateral low back pain with bilateral sciatica  M79.605 (ICD-10-CM) - Pain in left leg   Rationale for Evaluation and Treatment: Rehabilitation  THERAPY DIAG:  Other low back pain  Pain in right leg  Radiculopathy, lumbar region  Muscle weakness (generalized)  Difficulty in walking, not elsewhere classified  Cramp  and spasm  Abnormal posture  ONSET DATE: 09/14/2023  SUBJECTIVE:                                                                                                                                                                                           SUBJECTIVE STATEMENT: My back is not doing that well do to issues at home. Feels in B calves today. I've walked on the TM a bit, but that's about it.    PERTINENT HISTORY:  Multiple orthopedic and medical issues.   PAIN:  Are you having pain? Yes: NPRS scale: Currently 0 but 2-3/10 at worst in the past few weeks Pain location: low back and hips, buttocks Pain description:  aching Aggravating factors: standing or walking for long periods of time Relieving factors: rest  PRECAUTIONS: Fall  RED FLAGS: None   WEIGHT BEARING RESTRICTIONS: No  FALLS:  Has patient fallen in last 6 months? No  LIVING ENVIRONMENT: Lives with: lives with their spouse Lives in: House/apartment Stairs: Yes: Internal: 12 steps; on right going up and External: 4 steps; on right going up Has following equipment at home: 2 single point canes  OCCUPATION: retired  PLOF: Independent, Independent with basic ADLs, Independent with household mobility without device, Independent with community mobility without device, Independent with homemaking with ambulation, Independent with gait, and Independent with transfers  PATIENT GOALS: His goal is to build the strength in his legs enough to where he won't have to stop and rest so frequently and so that he can start walking again  NEXT MD VISIT: as needed  OBJECTIVE:  Note: Objective measures were completed at Evaluation unless otherwise noted.  DIAGNOSTIC FINDINGS:  09/07/23 IMPRESSION: Mild non-compressive disc bulges throughout the lumbar region as outlined above. Mild facet hypertrophy at L4-5. Mild bilateral foraminal narrowing at L4-5, not likely compressive.  PATIENT SURVEYS:  Modified Oswestry 23/50= 46%   COGNITION: Overall cognitive status: Within functional limits for tasks assessed     SENSATION: WFL  MUSCLE LENGTH: Hamstrings: Right 50 deg; Left 50 deg Thomas test: Right pos; Left pos  POSTURE: rounded shoulders, forward head, decreased lumbar lordosis, increased thoracic kyphosis, posterior pelvic tilt, and flexed trunk   PALPATION: na  LUMBAR ROM:   AROM eval  Flexion Fingertips to distal shin but with knees flexed  Extension 50%  Right lateral flexion Fingertips to joint line  Left lateral flexion Fingertips to just above joint line  Right rotation 50%  Left rotation 50%   (Blank rows = not  tested)  LOWER EXTREMITY ROM:     All anterior   LOWER EXTREMITY MMT:    Generally  4- to 4/5, hip extensors 3+/5 bilaterally, hip ER's 4-/5  LUMBAR SPECIAL TESTS:  Straight leg raise test: Negative  FUNCTIONAL TESTS:  5 times sit to stand: 10.42 sec Timed up and go (TUG): 14.30 sec  GAIT: Distance walked: 30 Assistive device utilized: 2 single point canes Level of assistance: SBA Comments: unsteady    TREATMENT DATE:  11/10/23 Nustep x 8 min level 5 (legs only per pt request)  (PT present to discuss status) B gastroc stretch 2x30 sec ea and soleus stretch 2x 20 sec Standing hip ABD, ext, march 2x10 ea B - cues for abdominal contraction to stabilize Seated hamstring stretch 3 x 30 sec Seated piriformis stretch 2 x 30 sec Seated abdominal work 5 # extending back x 10 Seated Russian twist with 5 lb 2 x 10 Sit to stand 2 x 10    11/07/23 Nustep x 8 min level 5 (legs only per pt request)  (PT present to discuss status) Standing hip ABD, ext, march 2x10 ea B - cues for abdominal contraction to stabilize Seated hamstring stretch 2 x 30 sec Seated piriformis stretch 2 x 30 sec Seated fwd press with 5 lb dumbbell 2 x 10 Seated overhead press with 5 lb dumbbell x 10 Seated Guernsey twist with 5 lb 2 x 10 Sit to stand 2 x 10 Heel raises x 10  11/02/23 Nustep x 5 min level 7  (PT present to discuss status) Seated hamstring stretch 3 x 30 sec Standing quad/hip flexor stretch 3 x 30 sec Seated piriformis stretch 3 x 30 sec Seated fwd press with 5 lb dumbbell 2 x 10 Seated overhead press with 5 lb dumbbell 2 x 10 Seated mini sit ups 2 x 10 with 5 lb dumbbell Seated Guernsey twist with 5 lb 2 x 10 Seated shoulder to hip with 5 lb dumbbell x 10 each side Seated thoracic stretch over purple ball x 10 holding 5 sec each (patient requested we not do this one as he has a painful neck vertebrae)  TREATMENT DATE: 10/26/23 Nustep x 5 min level 7  (PT present to discuss status) Seated  hamstring stretch 3 x 30 sec Seated overhead press with 5 lb dumbbell 2 x 10 Seated mini sit ups 2 x 10 with 5 lb dumbbell Seated Guernsey twist with 5 lb 2 x 10 Standing against wall for feedback : scapular retraction and cervical retraction x 10 holding 2-3 seconds (patient did 2 sets) Seated thoracic stretch over purple ball x 10 holding 5 sec each Standing lat pull down 2 x 10 with 30 lbs Sit to stand with fwd press using 5 lb 2 x 10 Facing wall: wing taps 2 x 10  TREATMENT DATE: 10/18/23 Nustep x 5 min level 7  (PT present to discuss status) Seated hamstring stretch 3 x 30 sec Seated hip flexor/quad stretch 3 x 30 sec Sit to stand 2 x 10 from mat  Standing hip ABD, ext, march 2x10 ea B - cues for abdominal contraction to stabilize Standing heel raises x 20 Seated mini sit up with 5 lb  2 x 10 Seated trunk extension 2 x 10 with 50# blue power band Farmer carry unilaterally with 10# R/L length of hallway ea  PATIENT EDUCATION:  Education details: Educated on posture and risk of natural fusion,  effect of sleeping in a recliner on posture and the difficulty of positioning to do effective core work.  Discussed aquatic therapy as an option if he is not responding well  to land PT.  Person educated: Patient Education method: Explanation, Demonstration, and Verbal cues Education comprehension: verbalized understanding, returned demonstration, and verbal cues required  HOME EXERCISE PROGRAM: Access Code: [email or web address] URL: [email or web address] Date: 10/18/2023 Prepared by: Mliss  Exercises - Sit to Stand  - 3 x daily - 7 x weekly - 1-2 sets - 10 reps - Standing Shoulder Horizontal Abduction with Resistance  - 1 x daily - 3 x weekly - 1-3 sets - 10 reps - Standing Shoulder Row with Anchored Resistance  - 1 x daily - 3 x weekly - 1-3 sets - 10 reps - Shoulder extension with resistance - Neutral  - 1 x daily - 3 x weekly - 1-3 sets - 10 reps - Seated Shoulder Horizontal  Abduction with Resistance - Thumbs Up  - 1 x daily - 3 x weekly - 1-3 sets - 10 reps - Seated Hip Flexor Stretch  - 2 x daily - 7 x weekly - 1 sets - 3 reps - 30 sec hold - Standing Hip Abduction with Counter Support  - 1 x daily - 3 x weekly - 2 sets - 10 reps - Standing Hip Extension with Counter Support  - 1 x daily - 3 x weekly - 2 sets - 10 reps - Standing March with Counter Support  - 1 x daily - 3 x weekly - 2 sets - 10 reps - Heel Raises with Counter Support  - 1 x daily - 3 x weekly - 2 sets - 10 reps  ASSESSMENT:  CLINICAL IMPRESSION: Patient reports he has not been compliant lately with HEP due to issues at home. He demonstrated increased fatigue compared to previous visits, but was able to complete exercises with minimal rest time. He reports increased cramping in B gastroc/soleus which we addressed with stretches today. He continues to demonstrate potential for improvement and would benefit from continued skilled therapy to address impairments.    OBJECTIVE IMPAIRMENTS: Abnormal gait, decreased activity tolerance, decreased balance, decreased mobility, difficulty walking, decreased ROM, decreased strength, hypomobility, increased fascial restrictions, increased muscle spasms, impaired flexibility, improper body mechanics, postural dysfunction, and pain.   ACTIVITY LIMITATIONS: carrying, lifting, bending, standing, squatting, sleeping, stairs, transfers, bed mobility, bathing, and dressing  PARTICIPATION LIMITATIONS: meal prep, cleaning, laundry, driving, shopping, community activity, yard work, and church  PERSONAL FACTORS: Fitness, Past/current experiences, Time since onset of injury/illness/exacerbation, and 3+ comorbidities: CAD, GERD, Htn, cervical spinal stenosis are also affecting patient's functional outcome.   REHAB POTENTIAL: Fair due to multiple co-morbidities  CLINICAL DECISION MAKING: Evolving/moderate complexity  EVALUATION COMPLEXITY: Moderate   GOALS: Goals  reviewed with patient? Yes  SHORT TERM GOALS: Target date: 10/25/2023  Pain report to be no greater than 4/10  Baseline: Goal status: IN PROGRESS; can walk 5 min with less than 4/10 pain  2.  Patient will be independent with initial HEP  Baseline:  Goal status: MET   LONG TERM GOALS: Target date: 11/22/2023  Patient to report pain no greater than 2/10  Baseline:  Goal status: MET 11/02/23  2.  Patient to be independent with advanced HEP  Baseline:  Goal status: In Progress  3.  Patient to report 85% improvement in overall symptoms Baseline:  Goal status: INITIAL  4.  Patient to be able to stand or walk for at least 15 min with pain no greater than 2/10  Baseline:  Goal status: INITIAL  5.  ODI to improve to 30% or better Baseline:  Goal status: INITIAL  6.  Functional scores to improve by 2-3 seconds Baseline:  Goal status: INITIAL  PLAN:  PT FREQUENCY: 1-2x/week  PT DURATION: 8 weeks  PLANNED INTERVENTIONS: 97110-Therapeutic exercises, 97530- Therapeutic activity, [id number]- Neuromuscular re-education, 97535- Self Care, [id number]- Manual therapy, [phone]- Gait training, [phone]- Aquatic Therapy, [phone]- Electrical stimulation (unattended), [phone]- Electrical stimulation (manual), [id number]- Ultrasound, [id number]- Traction (mechanical), [id number]- Ionotophoresis 4mg /ml Dexamethasone, Patient/Family education, Balance training, Stair training, Taping, Dry Needling, Joint mobilization, Spinal mobilization, Cryotherapy, and Moist heat.  PLAN FOR NEXT SESSION:  Nustep, core strengthening, flexibility, postural and thoracic extension strengthening.   Mliss Cummins, PT   11/10/23 12:44 PM [facility] [address] [place], [place] [address] Phone # [phone] Fax [phone]

## 2023-11-10 ENCOUNTER — Ambulatory Visit: Admitting: Physical Therapy

## 2023-11-10 ENCOUNTER — Encounter: Payer: Self-pay | Admitting: Physical Therapy

## 2023-11-10 DIAGNOSIS — M5459 Other low back pain: Secondary | ICD-10-CM

## 2023-11-10 DIAGNOSIS — R252 Cramp and spasm: Secondary | ICD-10-CM

## 2023-11-10 DIAGNOSIS — M79604 Pain in right leg: Secondary | ICD-10-CM

## 2023-11-10 DIAGNOSIS — R262 Difficulty in walking, not elsewhere classified: Secondary | ICD-10-CM

## 2023-11-10 DIAGNOSIS — R293 Abnormal posture: Secondary | ICD-10-CM

## 2023-11-10 DIAGNOSIS — M6281 Muscle weakness (generalized): Secondary | ICD-10-CM

## 2023-11-10 DIAGNOSIS — M5416 Radiculopathy, lumbar region: Secondary | ICD-10-CM

## 2023-11-14 ENCOUNTER — Ambulatory Visit: Admitting: Physical Therapy

## 2023-11-14 ENCOUNTER — Encounter: Payer: Self-pay | Admitting: Physical Therapy

## 2023-11-14 DIAGNOSIS — M5416 Radiculopathy, lumbar region: Secondary | ICD-10-CM

## 2023-11-14 DIAGNOSIS — M6281 Muscle weakness (generalized): Secondary | ICD-10-CM

## 2023-11-14 DIAGNOSIS — M5459 Other low back pain: Secondary | ICD-10-CM

## 2023-11-14 DIAGNOSIS — R262 Difficulty in walking, not elsewhere classified: Secondary | ICD-10-CM

## 2023-11-14 DIAGNOSIS — M79604 Pain in right leg: Secondary | ICD-10-CM

## 2023-11-14 NOTE — Therapy (Signed)
 OUTPATIENT PHYSICAL THERAPY THORACOLUMBAR TREATMENT  Progress Note Reporting Period 09/27/23 to 11/14/23  See note below for Objective Data and Assessment of Progress/Goals.      Patient Name: Todd Stevens MRN: [id number] DOB:1940-05-03, 83 y.o., male Today's Date: 11/14/2023  END OF SESSION:  PT End of Session - 11/14/23 1621     Visit Number 10    Date for PT Re-Evaluation 11/22/23    Authorization Type [employer]    Progress Note Due on Visit 20    PT Start Time 1619    PT Stop Time 1645    PT Time Calculation (min) 26 min    Activity Tolerance Patient tolerated treatment well    Behavior During Therapy WFL for tasks assessed/performed               Past Medical History:  Diagnosis Date   Anemia    none since 2017   Aortic valve sclerosis    CAD (coronary artery disease)    Cervical disc disease    neuropathy   Diverticulosis    Elevated hemidiaphragm    right, 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   Obesity    Pneumonia 01/2010   Hospitalized   Restless leg syndrome    Shortness of breath dyspnea    with exertion    Skin cancer    areas removed   Status post dilation of esophageal narrowing    Past Surgical History:  Procedure Laterality Date   CARDIAC CATHETERIZATION     COLONOSCOPY  08/2001   diverticulosis, internal hemorrhoids   COLONOSCOPY WITH PROPOFOL  N/A 12/07/2015   Procedure: COLONOSCOPY WITH PROPOFOL ;  Surgeon: Lupita FORBES Commander, MD;  Location: [facility];  Service: Endoscopy;  Laterality: N/A;   COLONOSCOPY WITH PROPOFOL  N/A 02/20/2017   Procedure: COLONOSCOPY WITH  PROPOFOL ;  Surgeon: Commander Lupita FORBES, MD;  Location: [facility];  Service: Endoscopy;  Laterality: N/A;   CORONARY ANGIOPLASTY WITH STENT PLACEMENT  2000   stent x 1    ESOPHAGOGASTRODUODENOSCOPY  01/2008   with esophageal dilation - esophageal dysmotility + stenosis, also benign fundic gland gastric polyps   FLEXIBLE SIGMOIDOSCOPY N/A 12/24/2015   Procedure: FLEXIBLE SIGMOIDOSCOPY;  Surgeon: Lupita FORBES Commander, MD;  Location: [facility];  Service: Endoscopy;  Laterality: N/A;   INGUINAL HERNIA REPAIR Right yrs ago   SKIN CANCER EXCISION     TONSILLECTOMY     Patient Active Problem List   Diagnosis Date Noted   Hx of adenomatous colonic polyps    Dyspnea 10/15/2010   Abdominal bloating 03/15/2010   Aortic valve disorder 02/25/2010   Coronary atherosclerosis 02/20/2010   HYPERLIPIDEMIA-MIXED 02/19/2010   Essential hypertension 02/19/2010   ESOPHAGEAL MOTILITY DISORDER 01/28/2008   GERD 01/28/2008    PCP: Yolande Toribio MATSU, MD  REFERRING PROVIDER: Vernetta Lonni GRADE, MD  REFERRING DIAG:  Diagnosis  M54.42,M54.41,G89.29 (ICD-10-CM) - Chronic bilateral low back pain with bilateral sciatica  M79.605 (ICD-10-CM) - Pain in left leg   Rationale for Evaluation and Treatment: Rehabilitation  THERAPY DIAG:  Other low back  pain  Pain in right leg  Radiculopathy, lumbar region  Muscle weakness (generalized)  Difficulty in walking, not elsewhere classified  ONSET DATE: 09/14/2023  SUBJECTIVE:                                                                                                                                                                                           SUBJECTIVE STATEMENT: I think the last session really helped.  I saw some improvement and my legs felt stronger. I feel like I might need to do similar things today but a little lighter b/c I'm tired.  I've not been sleeping well.  PERTINENT HISTORY:  Multiple orthopedic and medical issues.   PAIN:   Are you having pain? Yes: NPRS scale: Currently 0 but 2-3/10 at worst in the past few weeks Pain location: low back and hips, buttocks Pain description: aching Aggravating factors: standing or walking for long periods of time Relieving factors: rest  PRECAUTIONS: Fall  RED FLAGS: None   WEIGHT BEARING RESTRICTIONS: No  FALLS:  Has patient fallen in last 6 months? No  LIVING ENVIRONMENT: Lives with: lives with their spouse Lives in: House/apartment Stairs: Yes: Internal: 12 steps; on right going up and External: 4 steps; on right going up Has following equipment at home: 2 single point canes  OCCUPATION: retired  PLOF: Independent, Independent with basic ADLs, Independent with household mobility without device, Independent with community mobility without device, Independent with homemaking with ambulation, Independent with gait, and Independent with transfers  PATIENT GOALS: His goal is to build the strength in his legs enough to where he won't have to stop and rest so frequently and so that he can start walking again  NEXT MD VISIT: as needed  OBJECTIVE:  Note: Objective measures were completed at Evaluation unless otherwise noted.  DIAGNOSTIC FINDINGS:  09/07/23 IMPRESSION: Mild non-compressive disc bulges throughout the lumbar region as outlined above. Mild facet hypertrophy at L4-5. Mild bilateral foraminal narrowing at L4-5, not likely compressive.  PATIENT SURVEYS:  Modified Oswestry 23/50= 46%   COGNITION: Overall cognitive status: Within functional limits for tasks assessed     SENSATION: WFL  MUSCLE LENGTH: Hamstrings: Right 50 deg; Left 50 deg Thomas test: Right pos; Left pos  POSTURE: rounded shoulders, forward head, decreased lumbar lordosis, increased thoracic kyphosis, posterior pelvic tilt, and flexed trunk   PALPATION: na  LUMBAR ROM:   AROM eval  Flexion Fingertips to distal shin but with knees flexed  Extension 50%  Right lateral flexion  Fingertips to joint line  Left lateral flexion Fingertips to just above joint line  Right rotation 50%  Left rotation  50%   (Blank rows = not tested)  LOWER EXTREMITY ROM:     All anterior   LOWER EXTREMITY MMT:    Generally 4- to 4/5, hip extensors 3+/5 bilaterally, hip ER's 4-/5  LUMBAR SPECIAL TESTS:  Straight leg raise test: Negative  FUNCTIONAL TESTS:  5 times sit to stand: 10.42 sec Timed up and go (TUG): 14.30 sec  GAIT: Distance walked: 30 Assistive device utilized: 2 single point canes Level of assistance: SBA Comments: unsteady    TREATMENT DATE:  11/14/23 NuStep L5 x8' legs only (Pt request) Seated hamstring stretch 3 x 30 sec Sit to stand x10 Circuit at counter x 10 each: alt hip abd, alt hip ext, alt march x20, heel raises Seated piriformis stretch 2x30  11/10/23 Nustep x 8 min level 5 (legs only per pt request)  (PT present to discuss status) B gastroc stretch 2x30 sec ea and soleus stretch 2x 20 sec Standing hip ABD, ext, march 2x10 ea B - cues for abdominal contraction to stabilize Seated hamstring stretch 3 x 30 sec Seated piriformis stretch 2 x 30 sec Seated abdominal work 5 # extending back x 10 Seated Russian twist with 5 lb 2 x 10 Sit to stand 2 x 10    11/07/23 Nustep x 8 min level 5 (legs only per pt request)  (PT present to discuss status) Standing hip ABD, ext, march 2x10 ea B - cues for abdominal contraction to stabilize Seated hamstring stretch 2 x 30 sec Seated piriformis stretch 2 x 30 sec Seated fwd press with 5 lb dumbbell 2 x 10 Seated overhead press with 5 lb dumbbell x 10 Seated Gu[place]sey twist with 5 lb 2 x 10 Sit to stand 2 x 10 Heel raises x 10  11/02/23 Nustep x 5 min level 7  (PT present to discuss status) Seated hamstring stretch 3 x 30 sec Standing quad/hip flexor stretch 3 x 30 sec Seated piriformis stretch 3 x 30 sec Seated fwd press with 5 lb dumbbell 2 x 10 Seated overhead press with 5 lb dumbbell 2 x 10 Seated  mini sit ups 2 x 10 with 5 lb dumbbell Seated Guern[place] twist with 5 lb 2 x 10 Seated shoulder to hip with 5 lb dumbbell x 10 each side Seated thoracic stretch over purple ball x 10 holding 5 sec each (patient requested we not do this one as he has a painful neck vertebrae)   PATIENT EDUCATION:  Education details: Educated on posture and risk of natural fusion,  effect of sleeping in a recliner on posture and the difficulty of positioning to do effective core work.  Discussed aquatic therapy as an option if he is not responding well to land PT.  Person educated: Patient Education method: Explanation, Demonstration, and Verbal cues Education comprehension: verbalized understanding, returned demonstration, and verbal cues required  HOME EXERCISE PROGRAM: Access Code: [email or web address] URL: [email or web address] Date: 10/18/2023 Prepared by: Mliss  Exercises - Sit to Stand  - 3 x daily - 7 x weekly - 1-2 sets - 10 reps - Standing Shoulder Horizontal Abduction with Resistance  - 1 x daily - 3 x weekly - 1-3 sets - 10 reps - Standing Shoulder Row with Anchored Resistance  - 1 x daily - 3 x weekly - 1-3 sets - 10 reps - Shoulder extension with resistance - Neutral  - 1 x daily - 3 x weekly - 1-3 sets - 10 reps - Seated Shoulder Horizontal Abduction with Resistance - Thumbs Up  -  1 x daily - 3 x weekly - 1-3 sets - 10 reps - Seated Hip Flexor Stretch  - 2 x daily - 7 x weekly - 1 sets - 3 reps - 30 sec hold - Standing Hip Abduction with Counter Support  - 1 x daily - 3 x weekly - 2 sets - 10 reps - Standing Hip Extension with Counter Support  - 1 x daily - 3 x weekly - 2 sets - 10 reps - Standing March with Counter Support  - 1 x daily - 3 x weekly - 2 sets - 10 reps - Heel Raises with Counter Support  - 1 x daily - 3 x weekly - 2 sets - 10 reps  ASSESSMENT:  CLINICAL IMPRESSION: Patient reports he felt some improvement in leg strength since last session.  He is really connecting with  some of the standing counter therex.  He did arrive fatigued today and requested a lighter and shorter visit.  He needed frequent reminders to stand and sit tall through upper back with eyes forward during therex today.  OBJECTIVE IMPAIRMENTS: Abnormal gait, decreased activity tolerance, decreased balance, decreased mobility, difficulty walking, decreased ROM, decreased strength, hypomobility, increased fascial restrictions, increased muscle spasms, impaired flexibility, improper body mechanics, postural dysfunction, and pain.   ACTIVITY LIMITATIONS: carrying, lifting, bending, standing, squatting, sleeping, stairs, transfers, bed mobility, bathing, and dressing  PARTICIPATION LIMITATIONS: meal prep, cleaning, laundry, driving, shopping, community activity, yard work, and church  PERSONAL FACTORS: Fitness, Past/current experiences, Time since onset of injury/illness/exacerbation, and 3+ comorbidities: CAD, GERD, Htn, cervical spinal stenosis are also affecting patient's functional outcome.   REHAB POTENTIAL: Fair due to multiple co-morbidities  CLINICAL DECISION MAKING: Evolving/moderate complexity  EVALUATION COMPLEXITY: Moderate   GOALS: Goals reviewed with patient? Yes  SHORT TERM GOALS: Target date: 10/25/2023  Pain report to be no greater than 4/10  Baseline: Goal status: IN PROGRESS; can walk 5 min with less than 4/10 pain  2.  Patient will be independent with initial HEP  Baseline:  Goal status: MET   LONG TERM GOALS: Target date: 11/22/2023  Patient to report pain no greater than 2/10  Baseline:  Goal status: MET 11/02/23  2.  Patient to be independent with advanced HEP  Baseline:  Goal status: In Progress  3.  Patient to report 85% improvement in overall symptoms Baseline:  Goal status: INITIAL  4.  Patient to be able to stand or walk for at least 15 min with pain no greater than 2/10  Baseline:  Goal status: INITIAL  5.  ODI to improve to 30% or  better Baseline:  Goal status: INITIAL  6.  Functional scores to improve by 2-3 seconds Baseline:  Goal status: INITIAL  PLAN:  PT FREQUENCY: 1-2x/week  PT DURATION: 8 weeks  PLANNED INTERVENTIONS: 97110-Therapeutic exercises, 97530- Therapeutic activity, [id number]- Neuromuscular re-education, 97535- Self Care, [id number]- Manual therapy, [phone]- Gait training, [phone]- Aquatic Therapy, [phone]- Electrical stimulation (unattended), [phone]- Electrical stimulation (manual), [id number]- Ultrasound, [id number]- Traction (mechanical), [id number]- Ionotophoresis 4mg /ml Dexamethasone, Patient/Family education, Balance training, Stair training, Taping, Dry Needling, Joint mobilization, Spinal mobilization, Cryotherapy, and Moist heat.  PLAN FOR NEXT SESSION:  Nustep, core strengthening, flexibility, postural and thoracic extension strengthening.   Orvil Fester, PT 11/14/23 4:50 PM  [facility] [address], [address] [place], [place] [address] Phone # [phone] Fax [phone]

## 2023-11-16 ENCOUNTER — Ambulatory Visit

## 2023-11-16 ENCOUNTER — Ambulatory Visit: Admitting: Orthopaedic Surgery

## 2023-11-16 DIAGNOSIS — M79604 Pain in right leg: Secondary | ICD-10-CM

## 2023-11-16 DIAGNOSIS — M5416 Radiculopathy, lumbar region: Secondary | ICD-10-CM

## 2023-11-16 DIAGNOSIS — R262 Difficulty in walking, not elsewhere classified: Secondary | ICD-10-CM

## 2023-11-16 DIAGNOSIS — M5459 Other low back pain: Secondary | ICD-10-CM | POA: Diagnosis not present

## 2023-11-16 DIAGNOSIS — M6281 Muscle weakness (generalized): Secondary | ICD-10-CM

## 2023-11-16 DIAGNOSIS — R293 Abnormal posture: Secondary | ICD-10-CM

## 2023-11-16 DIAGNOSIS — R252 Cramp and spasm: Secondary | ICD-10-CM

## 2023-11-16 NOTE — Therapy (Signed)
 OUTPATIENT PHYSICAL THERAPY THORACOLUMBAR TREATMENT  Progress Note Reporting Period 09/27/23 to 11/14/23  See note below for Objective Data and Assessment of Progress/Goals.      Patient Name: Todd Stevens MRN: [id number] DOB:1940-09-21, 83 y.o., male Today's Date: 11/16/2023  END OF SESSION:  PT End of Session - 11/16/23 1408     Visit Number 11    Date for PT Re-Evaluation 11/22/23    Authorization Type [employer]    Progress Note Due on Visit 20    PT Start Time 1404    PT Stop Time 1445    PT Time Calculation (min) 41 min    Activity Tolerance Patient tolerated treatment well    Behavior During Therapy WFL for tasks assessed/performed               Past Medical History:  Diagnosis Date   Anemia    none since 2017   Aortic valve sclerosis    CAD (coronary artery disease)    Cervical disc disease    neuropathy   Diverticulosis    Elevated hemidiaphragm    right, 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   Obesity    Pneumonia 01/2010   Hospitalized   Restless leg syndrome    Shortness of breath dyspnea    with exertion    Skin cancer    areas removed   Status post dilation of esophageal narrowing    Past Surgical History:  Procedure Laterality Date   CARDIAC CATHETERIZATION     COLONOSCOPY  08/2001   diverticulosis, internal hemorrhoids   COLONOSCOPY WITH PROPOFOL  N/A 12/07/2015   Procedure: COLONOSCOPY WITH PROPOFOL ;  Surgeon: Lupita FORBES Commander, MD;  Location: [facility];  Service: Endoscopy;  Laterality: N/A;   COLONOSCOPY WITH PROPOFOL  N/A 02/20/2017   Procedure: COLONOSCOPY WITH  PROPOFOL ;  Surgeon: Commander Lupita FORBES, MD;  Location: [facility];  Service: Endoscopy;  Laterality: N/A;   CORONARY ANGIOPLASTY WITH STENT PLACEMENT  2000   stent x 1    ESOPHAGOGASTRODUODENOSCOPY  01/2008   with esophageal dilation - esophageal dysmotility + stenosis, also benign fundic gland gastric polyps   FLEXIBLE SIGMOIDOSCOPY N/A 12/24/2015   Procedure: FLEXIBLE SIGMOIDOSCOPY;  Surgeon: Lupita FORBES Commander, MD;  Location: [facility];  Service: Endoscopy;  Laterality: N/A;   INGUINAL HERNIA REPAIR Right yrs ago   SKIN CANCER EXCISION     TONSILLECTOMY     Patient Active Problem List   Diagnosis Date Noted   Hx of adenomatous colonic polyps    Dyspnea 10/15/2010   Abdominal bloating 03/15/2010   Aortic valve disorder 02/25/2010   Coronary atherosclerosis 02/20/2010   HYPERLIPIDEMIA-MIXED 02/19/2010   Essential hypertension 02/19/2010   ESOPHAGEAL MOTILITY DISORDER 01/28/2008   GERD 01/28/2008    PCP: Yolande Toribio MATSU, MD  REFERRING PROVIDER: Vernetta Lonni GRADE, MD  REFERRING DIAG:  Diagnosis  M54.42,M54.41,G89.29 (ICD-10-CM) - Chronic bilateral low back pain with bilateral sciatica  M79.605 (ICD-10-CM) - Pain in left leg   Rationale for Evaluation and Treatment: Rehabilitation  THERAPY DIAG:  Other low back  pain  Pain in right leg  Cramp and spasm  Difficulty in walking, not elsewhere classified  Radiculopathy, lumbar region  Muscle weakness (generalized)  Abnormal posture  ONSET DATE: 09/14/2023  SUBJECTIVE:                                                                                                                                                                                           SUBJECTIVE STATEMENT: Patient reports not doing good today.  Just a lot of family stuff going on  at home.   PERTINENT HISTORY:  Multiple orthopedic and medical issues.   PAIN:  Are you having pain? Yes: NPRS scale: Currently 0 but 2-3/10 at worst in the past  few weeks Pain location: low back and hips, buttocks Pain description: aching Aggravating factors: standing or walking for long periods of time Relieving factors: rest  PRECAUTIONS: Fall  RED FLAGS: None   WEIGHT BEARING RESTRICTIONS: No  FALLS:  Has patient fallen in last 6 months? No  LIVING ENVIRONMENT: Lives with: lives with their spouse Lives in: House/apartment Stairs: Yes: Internal: 12 steps; on right going up and External: 4 steps; on right going up Has following equipment at home: 2 single point canes  OCCUPATION: retired  PLOF: Independent, Independent with basic ADLs, Independent with household mobility without device, Independent with community mobility without device, Independent with homemaking with ambulation, Independent with gait, and Independent with transfers  PATIENT GOALS: His goal is to build the strength in his legs enough to where he won't have to stop and rest so frequently and so that he can start walking again  NEXT MD VISIT: as needed  OBJECTIVE:  Note: Objective measures were completed at Evaluation unless otherwise noted.  DIAGNOSTIC FINDINGS:  09/07/23 IMPRESSION: Mild non-compressive disc bulges throughout the lumbar region as outlined above. Mild facet hypertrophy at L4-5. Mild bilateral foraminal narrowing at L4-5, not likely compressive.  PATIENT SURVEYS:  Modified Oswestry 23/50= 46%   COGNITION: Overall cognitive status: Within functional limits for tasks assessed     SENSATION: WFL  MUSCLE LENGTH: Hamstrings: Right 50 deg; Left 50 deg Thomas test: Right pos; Left pos  POSTURE: rounded shoulders, forward head, decreased lumbar lordosis, increased thoracic kyphosis, posterior pelvic tilt, and flexed trunk   PALPATION: na  LUMBAR ROM:   AROM eval  Flexion Fingertips to distal shin but with knees flexed  Extension 50%  Right lateral flexion Fingertips to joint line  Left lateral flexion Fingertips to just above joint line   Right rotation 50%  Left rotation 50%   (Blank rows = not tested)  LOWER EXTREMITY ROM:  All anterior   LOWER EXTREMITY MMT:    Generally 4- to 4/5, hip extensors 3+/5 bilaterally, hip ER's 4-/5  LUMBAR SPECIAL TESTS:  Straight leg raise test: Negative  FUNCTIONAL TESTS:  5 times sit to stand: 10.42 sec Timed up and go (TUG): 14.30 sec  GAIT: Distance walked: 30 Assistive device utilized: 2 single point canes Level of assistance: SBA Comments: unsteady    TREATMENT DATE:  11/16/23 NuStep L5 x 10' legs only (Pt request) Leg Press 50# 2 x 10 (Seat 6) Lateral band walks with blue loop at barre x 5 laps Seated LAQ with 2.5 lbs 2 x 10 Seated march with 2.5 lbs x 20 Seated clam with yellow band x 20 Seated hamstring stretch 3 x 30 sec Sit to stand x10  11/14/23 NuStep L5 x8' legs only (Pt request) Seated hamstring stretch 3 x 30 sec Sit to stand x10 Circuit at counter x 10 each: alt hip abd, alt hip ext, alt march x20, heel raises Seated piriformis stretch 2x30  11/10/23 Nustep x 8 min level 5 (legs only per pt request)  (PT present to discuss status) B gastroc stretch 2x30 sec ea and soleus stretch 2x 20 sec Standing hip ABD, ext, march 2x10 ea B - cues for abdominal contraction to stabilize Seated hamstring stretch 3 x 30 sec Seated piriformis stretch 2 x 30 sec Seated abdominal work 5 # extending back x 10 Seated Russian twist with 5 lb 2 x 10 Sit to stand 2 x 10    PATIENT EDUCATION:  Education details: Educated on posture and risk of natural fusion,  effect of sleeping in a recliner on posture and the difficulty of positioning to do effective core work.  Discussed aquatic therapy as an option if he is not responding well to land PT.  Person educated: Patient Education method: Explanation, Demonstration, and Verbal cues Education comprehension: verbalized understanding, returned demonstration, and verbal cues required  HOME EXERCISE PROGRAM: Access Code:  [email or web address] URL: [email or web address] Date: 10/18/2023 Prepared by: Mliss  Exercises - Sit to Stand  - 3 x daily - 7 x weekly - 1-2 sets - 10 reps - Standing Shoulder Horizontal Abduction with Resistance  - 1 x daily - 3 x weekly - 1-3 sets - 10 reps - Standing Shoulder Row with Anchored Resistance  - 1 x daily - 3 x weekly - 1-3 sets - 10 reps - Shoulder extension with resistance - Neutral  - 1 x daily - 3 x weekly - 1-3 sets - 10 reps - Seated Shoulder Horizontal Abduction with Resistance - Thumbs Up  - 1 x daily - 3 x weekly - 1-3 sets - 10 reps - Seated Hip Flexor Stretch  - 2 x daily - 7 x weekly - 1 sets - 3 reps - 30 sec hold - Standing Hip Abduction with Counter Support  - 1 x daily - 3 x weekly - 2 sets - 10 reps - Standing Hip Extension with Counter Support  - 1 x daily - 3 x weekly - 2 sets - 10 reps - Standing March with Counter Support  - 1 x daily - 3 x weekly - 2 sets - 10 reps - Heel Raises with Counter Support  - 1 x daily - 3 x weekly - 2 sets - 10 reps  ASSESSMENT:  CLINICAL IMPRESSION: Patient is progressing appropriately.  He is struggling with some family issues that seem to be affecting him emotionally.  He was able to complete all tasks  today and is not needing rest breaks as frequently.  We focused on C spine ROM and posture today.  He responded well to this.  He would benefit from continuing skilled PT to address goals below.    OBJECTIVE IMPAIRMENTS: Abnormal gait, decreased activity tolerance, decreased balance, decreased mobility, difficulty walking, decreased ROM, decreased strength, hypomobility, increased fascial restrictions, increased muscle spasms, impaired flexibility, improper body mechanics, postural dysfunction, and pain.   ACTIVITY LIMITATIONS: carrying, lifting, bending, standing, squatting, sleeping, stairs, transfers, bed mobility, bathing, and dressing  PARTICIPATION LIMITATIONS: meal prep, cleaning, laundry, driving, shopping,  community activity, yard work, and church  PERSONAL FACTORS: Fitness, Past/current experiences, Time since onset of injury/illness/exacerbation, and 3+ comorbidities: CAD, GERD, Htn, cervical spinal stenosis are also affecting patient's functional outcome.   REHAB POTENTIAL: Fair due to multiple co-morbidities  CLINICAL DECISION MAKING: Evolving/moderate complexity  EVALUATION COMPLEXITY: Moderate   GOALS: Goals reviewed with patient? Yes  SHORT TERM GOALS: Target date: 10/25/2023  Pain report to be no greater than 4/10  Baseline: Goal status: IN PROGRESS; can walk 5 min with less than 4/10 pain  2.  Patient will be independent with initial HEP  Baseline:  Goal status: MET   LONG TERM GOALS: Target date: 11/22/2023  Patient to report pain no greater than 2/10  Baseline:  Goal status: MET 11/02/23  2.  Patient to be independent with advanced HEP  Baseline:  Goal status: In Progress  3.  Patient to report 85% improvement in overall symptoms Baseline:  Goal status: INITIAL  4.  Patient to be able to stand or walk for at least 15 min with pain no greater than 2/10  Baseline:  Goal status: INITIAL  5.  ODI to improve to 30% or better Baseline:  Goal status: INITIAL  6.  Functional scores to improve by 2-3 seconds Baseline:  Goal status: INITIAL  PLAN:  PT FREQUENCY: 1-2x/week  PT DURATION: 8 weeks  PLANNED INTERVENTIONS: 97110-Therapeutic exercises, 97530- Therapeutic activity, [id number]- Neuromuscular re-education, 97535- Self Care, [id number]- Manual therapy, [phone]- Gait training, [phone]- Aquatic Therapy, [phone]- Electrical stimulation (unattended), [phone]- Electrical stimulation (manual), [id number]- Ultrasound, [id number]- Traction (mechanical), [id number]- Ionotophoresis 4mg /ml Dexamethasone, Patient/Family education, Balance training, Stair training, Taping, Dry Needling, Joint mobilization, Spinal mobilization, Cryotherapy, and Moist heat.  PLAN FOR NEXT SESSION:  Nustep, core  strengthening, flexibility, postural and thoracic extension strengthening.   Delon B. Shamon Lobo, PT 11/16/23 10:13 PM [facility] [address], [address] [place], [place] [address] Phone # [phone] Fax [phone]

## 2023-11-21 ENCOUNTER — Ambulatory Visit

## 2023-11-21 DIAGNOSIS — M6281 Muscle weakness (generalized): Secondary | ICD-10-CM

## 2023-11-21 DIAGNOSIS — M5459 Other low back pain: Secondary | ICD-10-CM | POA: Diagnosis not present

## 2023-11-21 DIAGNOSIS — R252 Cramp and spasm: Secondary | ICD-10-CM

## 2023-11-21 DIAGNOSIS — R262 Difficulty in walking, not elsewhere classified: Secondary | ICD-10-CM

## 2023-11-21 DIAGNOSIS — R293 Abnormal posture: Secondary | ICD-10-CM

## 2023-11-21 DIAGNOSIS — M5416 Radiculopathy, lumbar region: Secondary | ICD-10-CM

## 2023-11-21 DIAGNOSIS — M79604 Pain in right leg: Secondary | ICD-10-CM

## 2023-11-21 NOTE — Therapy (Signed)
 OUTPATIENT PHYSICAL THERAPY THORACOLUMBAR TREATMENT PHYSICAL THERAPY DISCHARGE SUMMARY  Visits from Start of Care: 12  Current functional level related to goals / functional outcomes: See below   Remaining deficits: See below   Education / Equipment:  See Below   Patient agrees to discharge. Patient goals were not met. Patient is being discharged due to situation at home with his wife that needed to be dealt with and he was not able to continue at this time.       Patient Name: Todd Stevens MRN: [id number] DOB:05-03-1940, 83 y.o., male Today's Date: 11/21/2023  END OF SESSION:  PT End of Session - 11/21/23 1617     Visit Number 12    Date for PT Re-Evaluation 11/22/23    Authorization Type [employer]    Progress Note Due on Visit 20    PT Start Time 1615    PT Stop Time 1700    PT Time Calculation (min) 45 min    Activity Tolerance Patient tolerated treatment well    Behavior During Therapy [facility] for tasks assessed/performed               Past Medical History:  Diagnosis Date   Anemia    none since 2017   Aortic valve sclerosis    CAD (coronary artery disease)    Cervical disc disease    neuropathy   Diverticulosis    Elevated hemidiaphragm    right, 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   Obesity    Pneumonia 01/2010   Hospitalized   Restless leg syndrome    Shortness of breath dyspnea    with exertion    Skin cancer    areas removed   Status post dilation of esophageal narrowing    Past Surgical History:  Procedure Laterality Date   CARDIAC CATHETERIZATION     COLONOSCOPY   08/2001   diverticulosis, internal hemorrhoids   COLONOSCOPY WITH PROPOFOL  N/A 12/07/2015   Procedure: COLONOSCOPY WITH PROPOFOL ;  Surgeon: Lupita FORBES Commander, MD;  Location: [facility];  Service: Endoscopy;  Laterality: N/A;   COLONOSCOPY WITH PROPOFOL  N/A 02/20/2017   Procedure: COLONOSCOPY WITH PROPOFOL ;  Surgeon: Commander Lupita FORBES, MD;  Location: [facility];  Service: Endoscopy;  Laterality: N/A;   CORONARY ANGIOPLASTY WITH STENT PLACEMENT  2000   stent x 1    ESOPHAGOGASTRODUODENOSCOPY  01/2008   with esophageal dilation - esophageal dysmotility + stenosis, also benign fundic gland gastric polyps   FLEXIBLE SIGMOIDOSCOPY N/A 12/24/2015   Procedure: FLEXIBLE SIGMOIDOSCOPY;  Surgeon: Lupita FORBES Commander, MD;  Location: [facility];  Service: Endoscopy;  Laterality: N/A;   INGUINAL HERNIA REPAIR Right yrs ago   SKIN CANCER EXCISION     TONSILLECTOMY     Patient Active Problem List   Diagnosis Date Noted   Hx of adenomatous colonic polyps    Dyspnea 10/15/2010   Abdominal bloating 03/15/2010   Aortic valve disorder 02/25/2010   Coronary atherosclerosis 02/20/2010   HYPERLIPIDEMIA-MIXED 02/19/2010   Essential hypertension 02/19/2010   ESOPHAGEAL MOTILITY DISORDER 01/28/2008  GERD 01/28/2008    PCP: Yolande Toribio MATSU, MD  REFERRING PROVIDER: Vernetta Lonni GRADE, MD  REFERRING DIAG:  Diagnosis  M54.42,M54.41,G89.29 (ICD-10-CM) - Chronic bilateral low back pain with bilateral sciatica  M79.605 (ICD-10-CM) - Pain in left leg   Rationale for Evaluation and Treatment: Rehabilitation  THERAPY DIAG:  Other low back pain  Pain in right leg  Difficulty in walking, not elsewhere classified  Muscle weakness (generalized)  Cramp and spasm  Abnormal posture  Radiculopathy, lumbar region  ONSET DATE: 09/14/2023  SUBJECTIVE:                                                                                                                                                                                            SUBJECTIVE STATEMENT: Patient reports no significant change.  Family issues continue.   PERTINENT HISTORY:  Multiple orthopedic and medical issues.   PAIN:  Are you having pain? Yes: NPRS scale: Currently 0 but 2-3/10 at worst in the past few weeks Pain location: low back and hips, buttocks Pain description: aching Aggravating factors: standing or walking for long periods of time Relieving factors: rest  PRECAUTIONS: Fall  RED FLAGS: None   WEIGHT BEARING RESTRICTIONS: No  FALLS:  Has patient fallen in last 6 months? No  LIVING ENVIRONMENT: Lives with: lives with their spouse Lives in: House/apartment Stairs: Yes: Internal: 12 steps; on right going up and External: 4 steps; on right going up Has following equipment at home: 2 single point canes  OCCUPATION: retired  PLOF: Independent, Independent with basic ADLs, Independent with household mobility without device, Independent with community mobility without device, Independent with homemaking with ambulation, Independent with gait, and Independent with transfers  PATIENT GOALS: His goal is to build the strength in his legs enough to where he won't have to stop and rest so frequently and so that he can start walking again  NEXT MD VISIT: as needed  OBJECTIVE:  Note: Objective measures were completed at Evaluation unless otherwise noted.  DIAGNOSTIC FINDINGS:  09/07/23 IMPRESSION: Mild non-compressive disc bulges throughout the lumbar region as outlined above. Mild facet hypertrophy at L4-5. Mild bilateral foraminal narrowing at L4-5, not likely compressive.  PATIENT SURVEYS:  Modified Oswestry 23/50= 46%   COGNITION: Overall cognitive status: Within functional limits for tasks assessed     SENSATION: WFL  MUSCLE LENGTH: Hamstrings: Right 50 deg; Left 50 deg Thomas test: Right pos; Left pos  POSTURE: rounded shoulders, forward head, decreased lumbar lordosis, increased thoracic  kyphosis, posterior pelvic tilt, and flexed trunk   PALPATION: na  LUMBAR ROM:   AROM eval  Flexion Fingertips to distal  shin but with knees flexed  Extension 50%  Right lateral flexion Fingertips to joint line  Left lateral flexion Fingertips to just above joint line  Right rotation 50%  Left rotation 50%   (Blank rows = not tested)  LOWER EXTREMITY ROM:     All anterior   LOWER EXTREMITY MMT:    Generally 4- to 4/5, hip extensors 3+/5 bilaterally, hip ER's 4-/5  LUMBAR SPECIAL TESTS:  Straight leg raise test: Negative  FUNCTIONAL TESTS:  11/21/23: 5 times sit to stand: 12.27 sec Timed up and go (TUG): 11.80 sec  Initial eval: 5 times sit to stand: 10.42 sec Timed up and go (TUG): 14.30 sec  GAIT: Distance walked: 30 Assistive device utilized: 2 single point canes Level of assistance: SBA Comments: unsteady    TREATMENT DATE:  11/21/23 Nustep x 10 min legs only (PT present to discuss status) Upon explaining to patient that his POC date was expiring and discussing DC or recert, patient opened up about a situation with his wife and would like to go ahead and DC for now as the therapy was not likely going to be effective.  See below DC assessment completed. Reviewed HEP  11/16/23 NuStep L5 x 10' legs only (Pt request) Leg Press 50# 2 x 10 (Seat 6) Lateral band walks with blue loop at barre x 5 laps Seated LAQ with 2.5 lbs 2 x 10 Seated march with 2.5 lbs x 20 Seated clam with yellow band x 20 Seated hamstring stretch 3 x 30 sec Sit to stand x10  11/14/23 NuStep L5 x8' legs only (Pt request) Seated hamstring stretch 3 x 30 sec Sit to stand x10 Circuit at counter x 10 each: alt hip abd, alt hip ext, alt march x20, heel raises Seated piriformis stretch 2x30  11/10/23 Nustep x 8 min level 5 (legs only per pt request)  (PT present to discuss status) B gastroc stretch 2x30 sec ea and soleus stretch 2x 20 sec Standing hip ABD, ext, march 2x10 ea B - cues for  abdominal contraction to stabilize Seated hamstring stretch 3 x 30 sec Seated piriformis stretch 2 x 30 sec Seated abdominal work 5 # extending back x 10 Seated Russian twist with 5 lb 2 x 10 Sit to stand 2 x 10    PATIENT EDUCATION:  Education details: Educated on posture and risk of natural fusion,  effect of sleeping in a recliner on posture and the difficulty of positioning to do effective core work.  Discussed aquatic therapy as an option if he is not responding well to land PT.  Person educated: Patient Education method: Explanation, Demonstration, and Verbal cues Education comprehension: verbalized understanding, returned demonstration, and verbal cues required  HOME EXERCISE PROGRAM: Access Code: [email or web address] URL: [email or web address] Date: 10/18/2023 Prepared by: Mliss  Exercises - Sit to Stand  - 3 x daily - 7 x weekly - 1-2 sets - 10 reps - Standing Shoulder Horizontal Abduction with Resistance  - 1 x daily - 3 x weekly - 1-3 sets - 10 reps - Standing Shoulder Row with Anchored Resistance  - 1 x daily - 3 x weekly - 1-3 sets - 10 reps - Shoulder extension with resistance - Neutral  - 1 x daily - 3 x weekly - 1-3 sets - 10 reps - Seated Shoulder Horizontal Abduction with Resistance - Thumbs Up  - 1 x daily - 3 x weekly - 1-3 sets - 10 reps - Seated Hip Flexor Stretch  - 2 x daily -  7 x weekly - 1 sets - 3 reps - 30 sec hold - Standing Hip Abduction with Counter Support  - 1 x daily - 3 x weekly - 2 sets - 10 reps - Standing Hip Extension with Counter Support  - 1 x daily - 3 x weekly - 2 sets - 10 reps - Standing March with Counter Support  - 1 x daily - 3 x weekly - 2 sets - 10 reps - Heel Raises with Counter Support  - 1 x daily - 3 x weekly - 2 sets - 10 reps  ASSESSMENT:  CLINICAL IMPRESSION: Charlena had been making steady progress but seemed to be having some issues at home the last few visits.  He opened up about these issues today.  His wife of 63 years has  been committed x 2 in the past few months for psych issues.  He has moved out of his home due to his presence seems to trigger more of her behavior.  There are several family members involved in trying to get her help and establish POA but at this time he is just dealing with too much for his therapy to be effective so he would like to go ahead and DC at this time.  We reviewed all of his exercises and discussed a game plan for how to manage his stress through walking and exercise so that he isn't dealing with physical pain on top of the emotional strain he is having to deal with.  He will return if he is able once these matters are taken care of and his wife has appropriate care.  We will DC at this time.   OBJECTIVE IMPAIRMENTS: Abnormal gait, decreased activity tolerance, decreased balance, decreased mobility, difficulty walking, decreased ROM, decreased strength, hypomobility, increased fascial restrictions, increased muscle spasms, impaired flexibility, improper body mechanics, postural dysfunction, and pain.   ACTIVITY LIMITATIONS: carrying, lifting, bending, standing, squatting, sleeping, stairs, transfers, bed mobility, bathing, and dressing  PARTICIPATION LIMITATIONS: meal prep, cleaning, laundry, driving, shopping, community activity, yard work, and church  PERSONAL FACTORS: Fitness, Past/current experiences, Time since onset of injury/illness/exacerbation, and 3+ comorbidities: CAD, GERD, Htn, cervical spinal stenosis are also affecting patient's functional outcome.   REHAB POTENTIAL: Fair due to multiple co-morbidities  CLINICAL DECISION MAKING: Evolving/moderate complexity  EVALUATION COMPLEXITY: Moderate   GOALS: Goals reviewed with patient? Yes  SHORT TERM GOALS: Target date: 10/25/2023  Pain report to be no greater than 4/10  Baseline: Goal status: IN PROGRESS; can walk 5 min with less than 4/10 pain  2.  Patient will be independent with initial HEP  Baseline:  Goal status:  MET   LONG TERM GOALS: Target date: 11/22/2023  Patient to report pain no greater than 2/10  Baseline:  Goal status: MET 11/02/23  2.  Patient to be independent with advanced HEP  Baseline:  Goal status: Not met  3.  Patient to report 85% improvement in overall symptoms Baseline:  Goal status: Not met  4.  Patient to be able to stand or walk for at least 15 min with pain no greater than 2/10  Baseline:  Goal status: Partially met  5.  ODI to improve to 30% or better Baseline:  Goal status: did not retest  6.  Functional scores to improve by 2-3 seconds Baseline:  Goal status: Met TUG goal but not sit to stand.    PLAN:  PT FREQUENCY: 1-2x/week  PT DURATION: 8 weeks  PLANNED INTERVENTIONS: 97110-Therapeutic exercises, 97530- Therapeutic activity,  02887- Neuromuscular re-education, [phone]- Self Care, 02859- Manual therapy, [id number]- Gait training, [phone]- Aquatic Therapy, [phone]- Electrical stimulation (unattended), [phone]- Electrical stimulation (manual), [id number]- Ultrasound, [id number]- Traction (mechanical), [id number]- Ionotophoresis 4mg /ml Dexamethasone, Patient/Family education, Balance training, Stair training, Taping, Dry Needling, Joint mobilization, Spinal mobilization, Cryotherapy, and Moist heat.  PLAN FOR NEXT SESSION: DC  Kristian Mogg B. Eaton Folmar, PT 11/21/23 5:17 PM [facility] [address] [place], [place] [address] Phone # [phone] Fax [phone]

## 2023-11-23 ENCOUNTER — Ambulatory Visit

## 2023-11-27 ENCOUNTER — Encounter: Admitting: Physical Therapy

## 2024-03-04 ENCOUNTER — Encounter: Payer: Self-pay | Admitting: Radiology
